# Patient Record
Sex: Female | Born: 1980 | Race: Black or African American | Hispanic: No | Marital: Single | Smoking: Never smoker
Health system: Southern US, Community
[De-identification: ages and names within clinical notes are randomized; demographics above are authoritative.]

## PROBLEM LIST (undated history)

## (undated) DIAGNOSIS — I10 Essential (primary) hypertension: Secondary | ICD-10-CM

## (undated) DIAGNOSIS — L0292 Furuncle, unspecified: Secondary | ICD-10-CM

## (undated) HISTORY — PX: TUBAL LIGATION: SHX77

---

## 2009-02-06 ENCOUNTER — Emergency Department (HOSPITAL_BASED_OUTPATIENT_CLINIC_OR_DEPARTMENT_OTHER): Admission: EM | Admit: 2009-02-06 | Discharge: 2009-02-06 | Payer: Self-pay | Admitting: Emergency Medicine

## 2009-08-15 ENCOUNTER — Emergency Department (HOSPITAL_BASED_OUTPATIENT_CLINIC_OR_DEPARTMENT_OTHER): Admission: EM | Admit: 2009-08-15 | Discharge: 2009-08-16 | Payer: Self-pay | Admitting: Emergency Medicine

## 2009-08-18 ENCOUNTER — Emergency Department (HOSPITAL_BASED_OUTPATIENT_CLINIC_OR_DEPARTMENT_OTHER): Admission: EM | Admit: 2009-08-18 | Discharge: 2009-08-18 | Payer: Self-pay | Admitting: Emergency Medicine

## 2009-10-26 ENCOUNTER — Ambulatory Visit: Payer: Self-pay | Admitting: Diagnostic Radiology

## 2009-10-26 ENCOUNTER — Emergency Department (HOSPITAL_BASED_OUTPATIENT_CLINIC_OR_DEPARTMENT_OTHER): Admission: EM | Admit: 2009-10-26 | Discharge: 2009-10-26 | Payer: Self-pay | Admitting: Emergency Medicine

## 2010-03-12 ENCOUNTER — Emergency Department (HOSPITAL_BASED_OUTPATIENT_CLINIC_OR_DEPARTMENT_OTHER): Admission: EM | Admit: 2010-03-12 | Discharge: 2010-03-12 | Payer: Self-pay | Admitting: Emergency Medicine

## 2010-03-22 ENCOUNTER — Emergency Department (HOSPITAL_BASED_OUTPATIENT_CLINIC_OR_DEPARTMENT_OTHER): Admission: EM | Admit: 2010-03-22 | Discharge: 2010-03-22 | Payer: Self-pay | Admitting: Emergency Medicine

## 2010-12-17 ENCOUNTER — Inpatient Hospital Stay (HOSPITAL_COMMUNITY): Payer: Medicaid Other

## 2010-12-17 ENCOUNTER — Inpatient Hospital Stay (HOSPITAL_COMMUNITY)
Admission: AD | Admit: 2010-12-17 | Discharge: 2010-12-18 | Disposition: A | Discharge status: Home / Self Care | Payer: Medicaid Other | Source: Ambulatory Visit | Attending: Family Medicine | Admitting: Family Medicine

## 2010-12-17 DIAGNOSIS — O479 False labor, unspecified: Secondary | ICD-10-CM | POA: Insufficient documentation

## 2010-12-17 DIAGNOSIS — W108XXA Fall (on) (from) other stairs and steps, initial encounter: Secondary | ICD-10-CM | POA: Insufficient documentation

## 2010-12-17 DIAGNOSIS — O9989 Other specified diseases and conditions complicating pregnancy, childbirth and the puerperium: Secondary | ICD-10-CM

## 2010-12-17 DIAGNOSIS — O99891 Other specified diseases and conditions complicating pregnancy: Secondary | ICD-10-CM | POA: Insufficient documentation

## 2010-12-18 LAB — WET PREP, GENITAL: Yeast Wet Prep HPF POC: NONE SEEN

## 2010-12-18 LAB — COMPREHENSIVE METABOLIC PANEL
ALT: 8 U/L (ref 0–35)
AST: 14 U/L (ref 0–37)
Alkaline Phosphatase: 470 U/L — ABNORMAL HIGH (ref 39–117)
BUN: 3 mg/dL — ABNORMAL LOW (ref 6–23)
GFR calc Af Amer: >60 mL/min (ref >60)
GFR calc non Af Amer: >60 mL/min (ref >60)
Sodium: 137 mEq/L (ref 135–145)
Total Bilirubin: 0.4 mg/dL (ref 0.3–1.2)
Total Protein: 6.4 g/dL (ref 6.0–8.3)

## 2010-12-18 LAB — URINALYSIS, ROUTINE W REFLEX MICROSCOPIC
Bilirubin Urine: NEGATIVE
Glucose, UA: NEGATIVE mg/dL
Ketones, ur: 15 mg/dL — AB
Nitrite: NEGATIVE
Specific Gravity, Urine: 1.025 (ref 1.005–1.030)

## 2010-12-18 LAB — PROTEIN / CREATININE RATIO, URINE
Creatinine, Urine: 159.9 mg/dL
Protein Creatinine Ratio: 0.27 — ABNORMAL HIGH (ref 0.00–0.15)

## 2010-12-18 LAB — CBC
Hemoglobin: 8.7 g/dL — ABNORMAL LOW (ref 12.0–15.0)
MCHC: 30.4 g/dL (ref 30.0–36.0)
RBC: 3.63 MIL/uL — ABNORMAL LOW (ref 3.87–5.11)
WBC: 14.2 10*3/uL — ABNORMAL HIGH (ref 4.0–10.5)

## 2010-12-18 LAB — URINE MICROSCOPIC-ADD ON

## 2010-12-18 LAB — RAPID URINE DRUG SCREEN, HOSP PERFORMED
Benzodiazepines: NOT DETECTED
Opiates: NOT DETECTED
Tetrahydrocannabinol: NOT DETECTED

## 2011-01-02 LAB — COMPREHENSIVE METABOLIC PANEL
AST: 12 U/L (ref 0–37)
BUN: 6 mg/dL (ref 6–23)
Calcium: 9.7 mg/dL (ref 8.4–10.5)
Chloride: 109 mEq/L (ref 96–112)
Creatinine, Ser: 0.5 mg/dL (ref 0.4–1.2)
GFR calc Af Amer: >60 mL/min (ref >60)
GFR calc non Af Amer: >60 mL/min (ref >60)
Sodium: 149 mEq/L — ABNORMAL HIGH (ref 135–145)

## 2011-01-02 LAB — URINALYSIS, ROUTINE W REFLEX MICROSCOPIC
Bilirubin Urine: NEGATIVE
Ketones, ur: 15 mg/dL — AB
Nitrite: NEGATIVE
Specific Gravity, Urine: 1.034 — ABNORMAL HIGH (ref 1.005–1.030)

## 2011-01-02 LAB — URINE MICROSCOPIC-ADD ON

## 2011-01-18 LAB — PREGNANCY, URINE: Preg Test, Ur: NEGATIVE

## 2011-01-25 ENCOUNTER — Emergency Department (HOSPITAL_BASED_OUTPATIENT_CLINIC_OR_DEPARTMENT_OTHER)
Admission: EM | Admit: 2011-01-25 | Discharge: 2011-01-25 | Disposition: A | Discharge status: Home / Self Care | Payer: Medicaid Other | Attending: Emergency Medicine | Admitting: Emergency Medicine

## 2011-01-25 ENCOUNTER — Emergency Department (INDEPENDENT_AMBULATORY_CARE_PROVIDER_SITE_OTHER): Payer: Medicaid Other

## 2011-01-25 DIAGNOSIS — M545 Low back pain, unspecified: Secondary | ICD-10-CM | POA: Insufficient documentation

## 2011-01-25 DIAGNOSIS — R109 Unspecified abdominal pain: Secondary | ICD-10-CM | POA: Insufficient documentation

## 2011-01-25 DIAGNOSIS — N83209 Unspecified ovarian cyst, unspecified side: Secondary | ICD-10-CM | POA: Insufficient documentation

## 2011-01-25 DIAGNOSIS — N949 Unspecified condition associated with female genital organs and menstrual cycle: Secondary | ICD-10-CM

## 2011-01-25 DIAGNOSIS — N72 Inflammatory disease of cervix uteri: Secondary | ICD-10-CM | POA: Insufficient documentation

## 2011-01-25 DIAGNOSIS — M549 Dorsalgia, unspecified: Secondary | ICD-10-CM

## 2011-01-25 LAB — COMPREHENSIVE METABOLIC PANEL
ALT: 10 U/L (ref 0–35)
Albumin: 5.1 g/dL (ref 3.5–5.2)
Alkaline Phosphatase: 97 U/L (ref 39–117)
BUN: 7 mg/dL (ref 6–23)
CO2: 27 mEq/L (ref 19–32)
Calcium: 9.8 mg/dL (ref 8.4–10.5)
Creatinine, Ser: 0.6 mg/dL (ref 0.4–1.2)
Sodium: 145 mEq/L (ref 135–145)

## 2011-01-25 LAB — DIFFERENTIAL
Basophils Relative: 1 % (ref 0–1)
Eosinophils Absolute: 0.1 10*3/uL (ref 0.0–0.7)
Eosinophils Relative: 1 % (ref 0–5)
Lymphocytes Relative: 30 % (ref 12–46)
Lymphs Abs: 4.3 10*3/uL — ABNORMAL HIGH (ref 0.7–4.0)
Monocytes Relative: 4 % (ref 3–12)
Neutro Abs: 9.1 10*3/uL — ABNORMAL HIGH (ref 1.7–7.7)

## 2011-01-25 LAB — URINALYSIS, ROUTINE W REFLEX MICROSCOPIC
Bilirubin Urine: NEGATIVE
Bilirubin Urine: NEGATIVE
Protein, ur: NEGATIVE mg/dL
Specific Gravity, Urine: 1.012 (ref 1.005–1.030)
Specific Gravity, Urine: 1.023 (ref 1.005–1.030)

## 2011-01-25 LAB — CBC
HCT: 40.1 % (ref 36.0–46.0)
Hemoglobin: 12.9 g/dL (ref 12.0–15.0)
MCHC: 32 g/dL (ref 30.0–36.0)
MCV: 77.4 fL — ABNORMAL LOW (ref 78.0–100.0)
RDW: 15.6 % — ABNORMAL HIGH (ref 11.5–15.5)

## 2011-01-25 LAB — URINE MICROSCOPIC-ADD ON

## 2011-01-25 LAB — WET PREP, GENITAL
Trich, Wet Prep: NONE SEEN
Yeast Wet Prep HPF POC: NONE SEEN

## 2011-01-25 LAB — URINE CULTURE: Colony Count: >=100000

## 2011-01-25 LAB — PREGNANCY, URINE
Preg Test, Ur: NEGATIVE
Preg Test, Ur: NEGATIVE

## 2011-01-26 LAB — GC/CHLAMYDIA PROBE AMP, GENITAL: Chlamydia, DNA Probe: NEGATIVE

## 2012-05-28 IMAGING — US US TRANSVAGINAL NON-OB
1 series · 14 of 25 positions shown · non-contrast
Comparison: Obstetrical sonogram 12/17/2010

CLINICAL DATA: Abdomen, back, and vaginal pain.  Postpartum 1
month.

TRANSABDOMINAL AND TRANSVAGINAL ULTRASOUND OF PELVIS
TECHNIQUE: Both transabdominal and transvaginal ultrasound
examinations of the pelvis were performed. Transabdominal technique
was performed for global imaging of the pelvis including uterus,
ovaries, adnexal regions, and pelvic cul-de-sac.
It was necessary to proceed with endovaginal exam following the
transabdominal exam to visualize the ovaries, which are posterior
in position.

[Series 1: us transvaginal non-ob · 0.30mm/px · 14 of 69 slices shown]
[im 1/69]
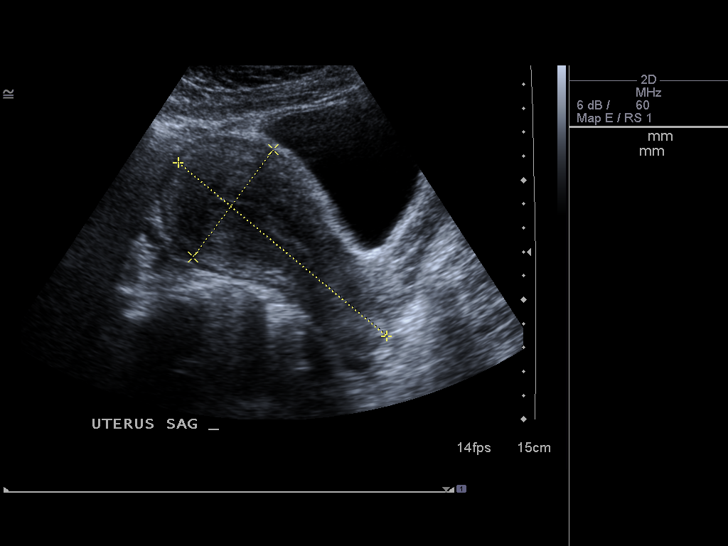
[im 6/69]
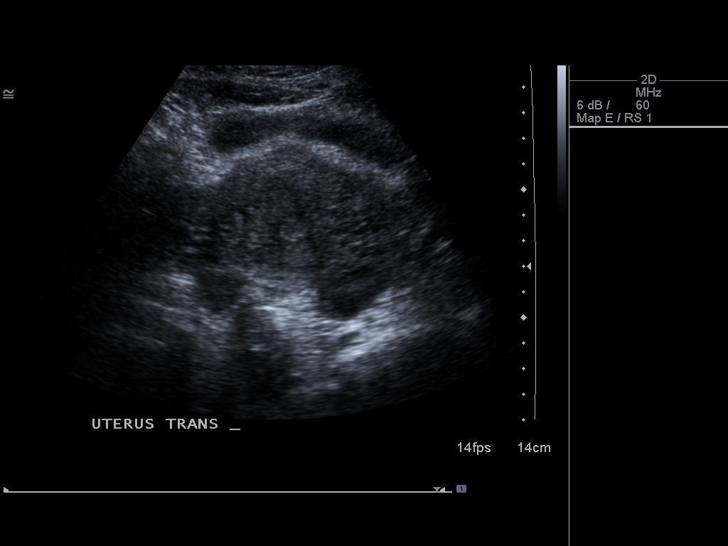
[im 12/69]
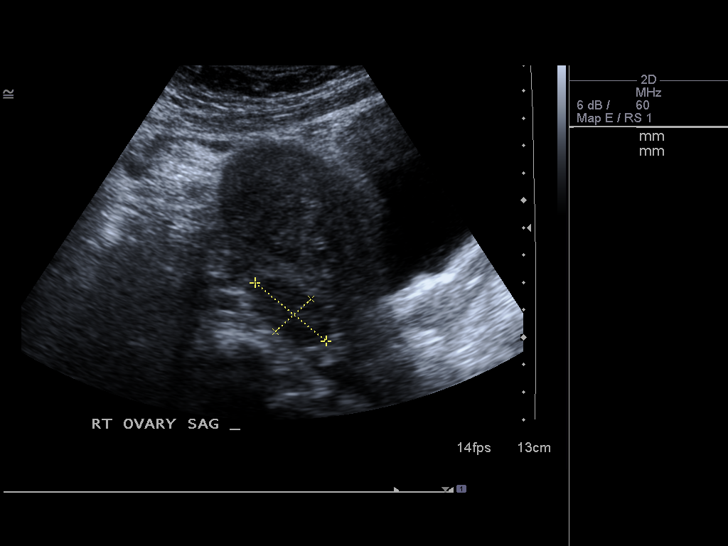
[im 18/69]
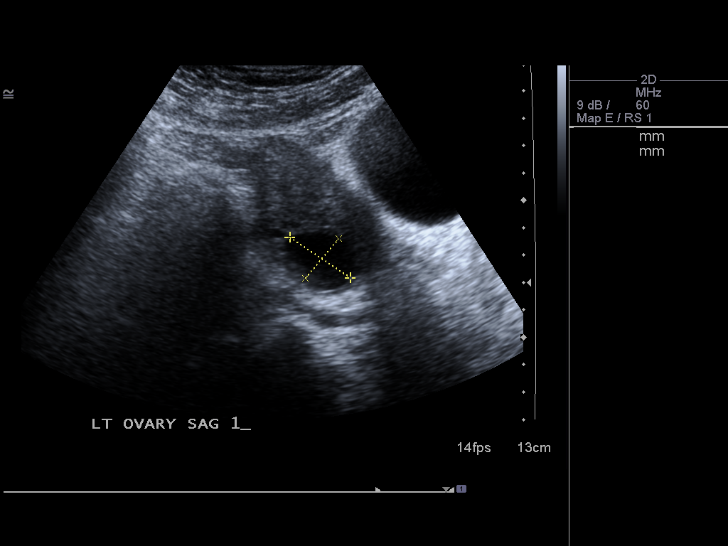
[im 23/69]
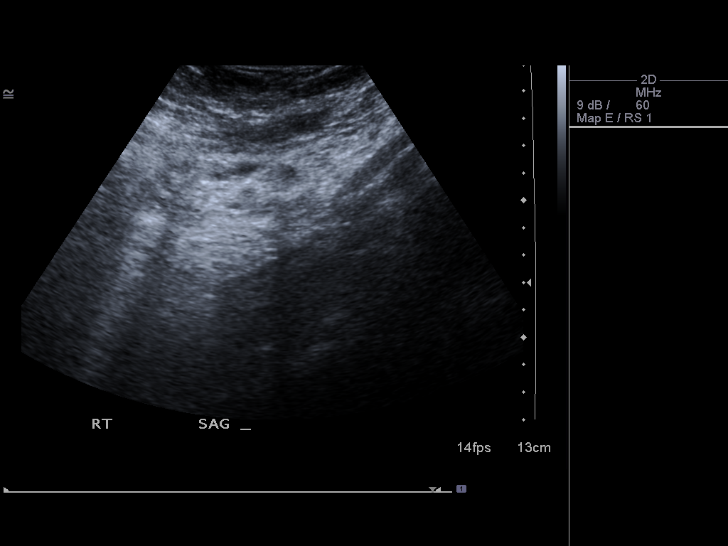
[im 26/69]
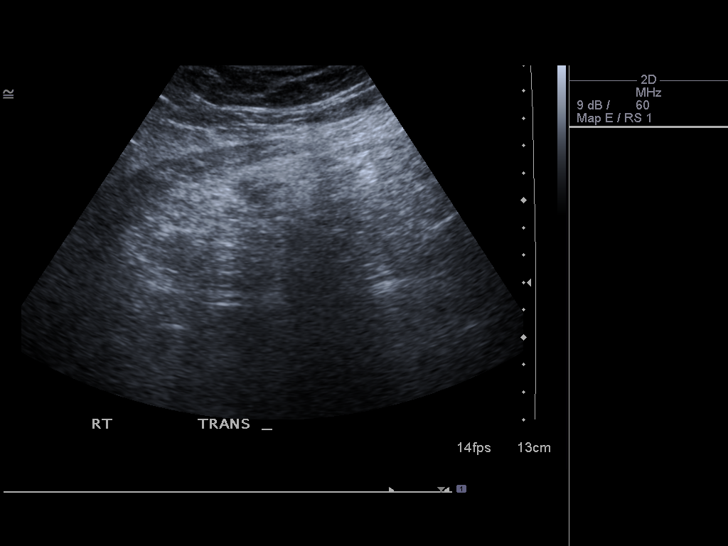
[im 32/69]
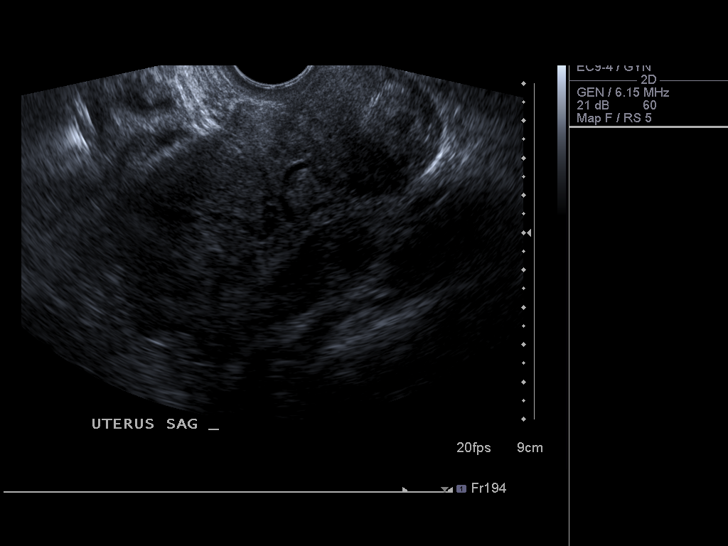
[im 37/69]
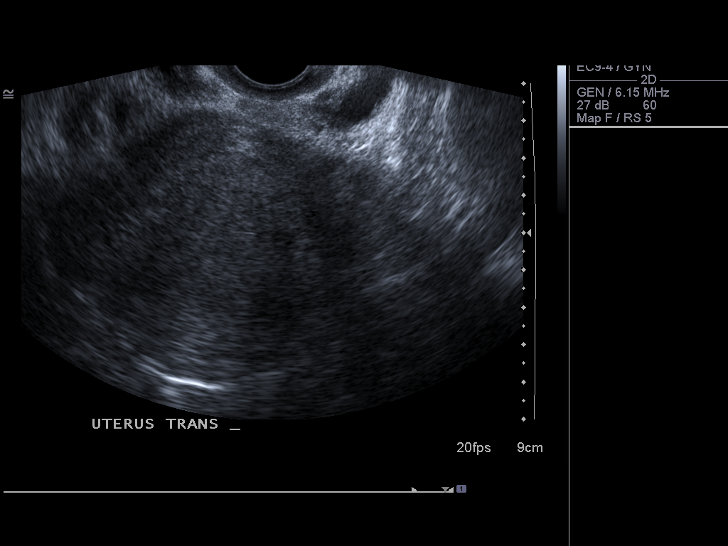
[im 43/69]
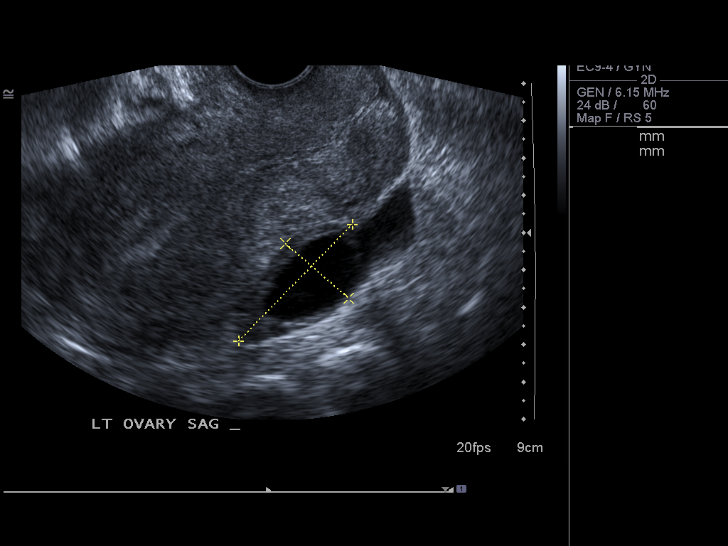
[im 46/69]
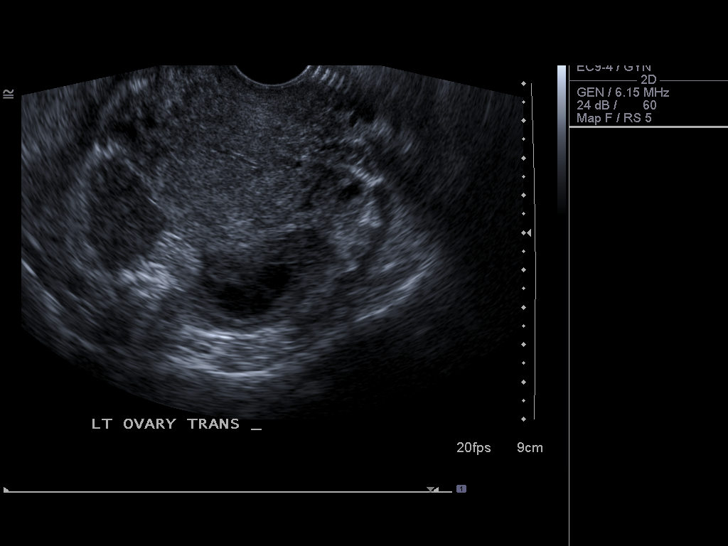
[im 52/69]
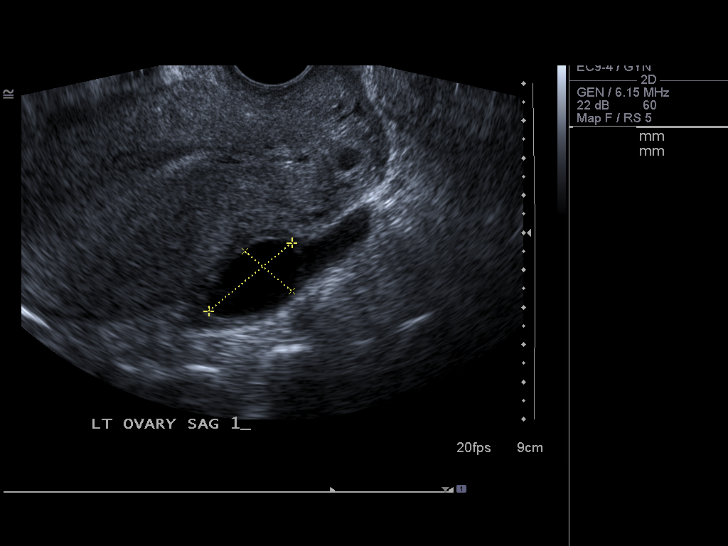
[im 57/69]
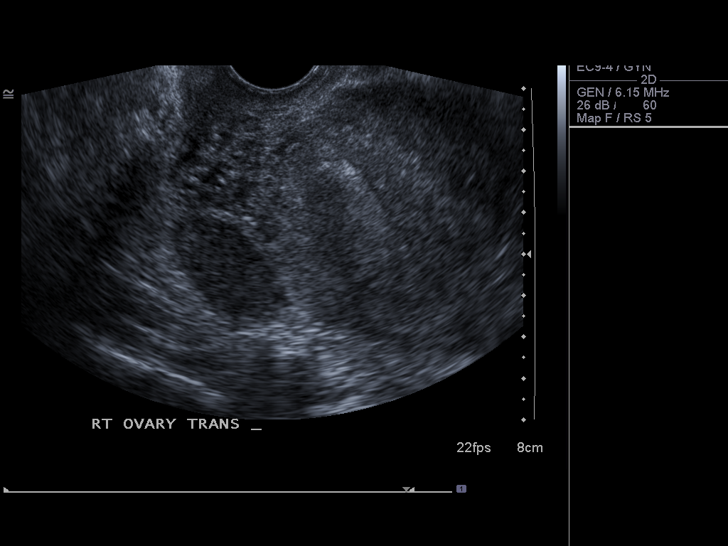
[im 63/69]
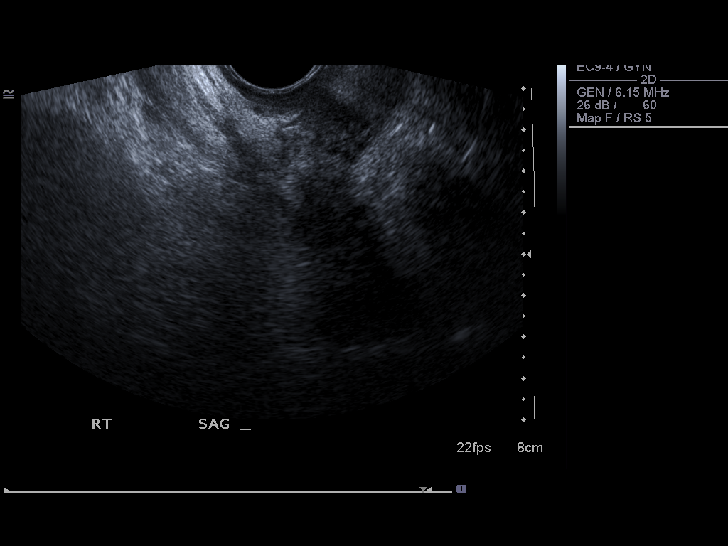
[im 69/69]
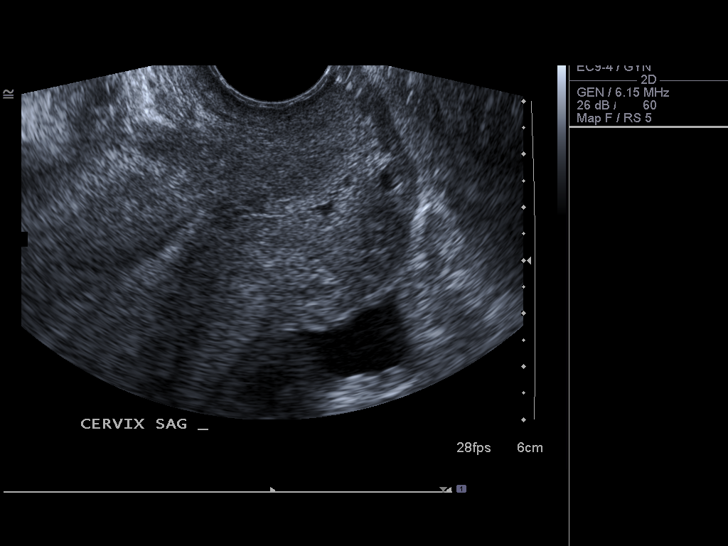

[14 of 25 positions shown; findings below may reference images not displayed]

FINDINGS: Uterus:  11.3 x 5.6 x 7.5 cm (length x AP x width).  No fibroids or
other uterine masses identified.

Endometrium:  9 mm and homogeneous.

Right ovary:  3.2 x 2.1 x 2.4 cm.  Normal appearance/no adnexal
mass.

Left ovary:  4.4 x 2.2 x 3.1 cm.    There is a 2.9 cm simple cyst.

Other findings:  There is a trace amount of free pelvic fluid,
unlikely to be clinically significant.
IMPRESSION: No pathological findings.

## 2013-08-25 ENCOUNTER — Emergency Department (HOSPITAL_BASED_OUTPATIENT_CLINIC_OR_DEPARTMENT_OTHER)
Admission: EM | Admit: 2013-08-25 | Discharge: 2013-08-25 | Disposition: A | Discharge status: Home / Self Care | Payer: Medicaid Other | Attending: Emergency Medicine | Admitting: Emergency Medicine

## 2013-08-25 ENCOUNTER — Encounter (HOSPITAL_BASED_OUTPATIENT_CLINIC_OR_DEPARTMENT_OTHER): Payer: Self-pay | Admitting: Emergency Medicine

## 2013-08-25 DIAGNOSIS — L02219 Cutaneous abscess of trunk, unspecified: Secondary | ICD-10-CM | POA: Insufficient documentation

## 2013-08-25 DIAGNOSIS — I10 Essential (primary) hypertension: Secondary | ICD-10-CM | POA: Insufficient documentation

## 2013-08-25 DIAGNOSIS — L0291 Cutaneous abscess, unspecified: Secondary | ICD-10-CM

## 2013-08-25 HISTORY — DX: Essential (primary) hypertension: I10

## 2013-08-25 MED ORDER — SULFAMETHOXAZOLE-TRIMETHOPRIM 800-160 MG PO TABS: 1.0000 | ORAL_TABLET | Freq: Two times a day (BID) | ORAL | Status: AC

## 2013-08-25 MED ORDER — HYDROCODONE-ACETAMINOPHEN 5-325 MG PO TABS: 2.0000 | ORAL_TABLET | ORAL | Status: DC | PRN

## 2013-08-25 NOTE — ED Notes (Signed)
Abscess to her right groin.

## 2013-08-25 NOTE — ED Provider Notes (Signed)
Medical screening examination/treatment/procedure(s) were performed by non-physician practitioner and as supervising physician I was immediately available for consultation/collaboration.  EKG Interpretation   None         Junius Argyle, MD 08/25/13 2342

## 2013-08-25 NOTE — ED Provider Notes (Signed)
CSN: [id number]     Arrival date & time 08/25/13  1614 History   First MD Initiated Contact with Patient 08/25/13 1624     Chief Complaint  Patient presents with  . Abscess   (Consider location/radiation/quality/duration/timing/severity/associated sxs/prior Treatment) Patient is a 32 y.o. female presenting with abscess. The history is provided by the patient. No language interpreter was used.  Abscess Location:  Ano-genital Ano-genital abscess location:  Groin Size:  2 Abscess quality: fluctuance, induration, redness and warmth   Abscess quality: not draining   Red streaking: no   Progression:  Worsening Chronicity:  New Context: not diabetes   Relieved by:  Nothing Worsened by:  Nothing tried Ineffective treatments:  None tried   Past Medical History  Diagnosis Date  . Hypertension    Past Surgical History  Procedure Laterality Date  . Tubal ligation     No family history on file. History  Substance Use Topics  . Smoking status: Never Smoker   . Smokeless tobacco: Not on file  . Alcohol Use: No   OB History   Grav Para Term Preterm Abortions TAB SAB Ect Mult Living                 Review of Systems  Skin: Positive for wound.  All other systems reviewed and are negative.    Allergies  Review of patient's allergies indicates no known allergies.  Home Medications   Current Outpatient Rx  Name  Route  Sig  Dispense  Refill  . Carvedilol (COREG PO)   Oral   Take by mouth.          BP 165/95  Pulse 106  Temp(Src) 98.5 F (36.9 C) (Oral)  Resp 16  Ht 5\' 5"  (1.651 m)  Wt 160 lb (72.576 kg)  BMI 26.63 kg/m2  SpO2 100% Physical Exam  Nursing note and vitals reviewed. Constitutional: She is oriented to person, place, and time. She appears well-developed and well-nourished.  HENT:  Head: Normocephalic.  Eyes: Pupils are equal, round, and reactive to light.  Neck: Normal range of motion.  Cardiovascular: Normal rate.   Pulmonary/Chest: Effort  normal.  Abdominal: Soft.  Neurological: She is alert and oriented to person, place, and time.  Skin: There is erythema.  2x3 cm area of erythema  Psychiatric: She has a normal mood and affect.    ED Course  INCISION AND DRAINAGE Date/Time: 08/25/2013 5:42 PM Performed by: Cheron Schaumann K Authorized by: Cheron Schaumann K Risks and benefits: risks, benefits and alternatives were discussed Consent given by: patient Required items: required blood products, implants, devices, and special equipment available Patient identity confirmed: verbally with patient Time out: Immediately prior to procedure a "time out" was called to verify the correct patient, procedure, equipment, support staff and site/side marked as required. Type: abscess Body area: trunk Anesthesia: local infiltration Local anesthetic: lidocaine 2% without epinephrine Scalpel size: 11 Incision type: single straight Complexity: simple Drainage: purulent Drainage amount: scant Packing material: 1/2 in gauze Patient tolerance: Patient tolerated the procedure well with no immediate complications.   (including critical care time) Labs Review Labs Reviewed - No data to display Imaging Review No results found.  EKG Interpretation   None       MDM   1. Abscess    Bactrim and hydrocodone    Elson Areas, PA-C 08/25/13 1746

## 2013-12-13 ENCOUNTER — Encounter (HOSPITAL_BASED_OUTPATIENT_CLINIC_OR_DEPARTMENT_OTHER): Payer: Self-pay | Admitting: Emergency Medicine

## 2013-12-13 ENCOUNTER — Emergency Department (HOSPITAL_BASED_OUTPATIENT_CLINIC_OR_DEPARTMENT_OTHER)
Admission: EM | Admit: 2013-12-13 | Discharge: 2013-12-13 | Disposition: A | Discharge status: Home / Self Care | Payer: Medicaid Other | Attending: Emergency Medicine | Admitting: Emergency Medicine

## 2013-12-13 DIAGNOSIS — L0291 Cutaneous abscess, unspecified: Secondary | ICD-10-CM

## 2013-12-13 DIAGNOSIS — IMO0002 Reserved for concepts with insufficient information to code with codable children: Secondary | ICD-10-CM | POA: Insufficient documentation

## 2013-12-13 DIAGNOSIS — Z79899 Other long term (current) drug therapy: Secondary | ICD-10-CM | POA: Insufficient documentation

## 2013-12-13 DIAGNOSIS — I10 Essential (primary) hypertension: Secondary | ICD-10-CM | POA: Insufficient documentation

## 2013-12-13 HISTORY — DX: Furuncle, unspecified: L02.92

## 2013-12-13 MED ORDER — HYDROCODONE-ACETAMINOPHEN 5-325 MG PO TABS: 1.0000 | ORAL_TABLET | Freq: Four times a day (QID) | ORAL | Status: DC | PRN

## 2013-12-13 MED ORDER — SULFAMETHOXAZOLE-TMP DS 800-160 MG PO TABS
1.0000 | ORAL_TABLET | Freq: Once | ORAL | Status: AC
  Administered 2013-12-13: 1 via ORAL
  Filled 2013-12-13: qty 1

## 2013-12-13 MED ORDER — SULFAMETHOXAZOLE-TRIMETHOPRIM 800-160 MG PO TABS: 1.0000 | ORAL_TABLET | Freq: Two times a day (BID) | ORAL | Status: DC

## 2013-12-13 NOTE — ED Provider Notes (Signed)
CSN: [id number]     Arrival date & time 12/13/13  0309 History   None    Chief Complaint  Patient presents with  . Recurrent Skin Infections     (Consider location/radiation/quality/duration/timing/severity/associated sxs/prior Treatment) Patient is a 33 y.o. female presenting with abscess. The history is provided by the patient.  Abscess Location:  Shoulder/arm Shoulder/arm abscess location:  R axilla Abscess quality: induration, painful and redness   Red streaking: no   Progression:  Worsening Pain details:    Quality:  Dull   Severity:  Moderate   Timing:  Constant   Progression:  Worsening Chronicity:  Recurrent Context: not diabetes   Relieved by:  Nothing Worsened by:  Nothing tried Ineffective treatments:  None tried Associated symptoms: no anorexia   Risk factors: no family hx of MRSA     Past Medical History  Diagnosis Date  . Hypertension   . Boil    Past Surgical History  Procedure Laterality Date  . Tubal ligation     History reviewed. No pertinent family history. History  Substance Use Topics  . Smoking status: Never Smoker   . Smokeless tobacco: Not on file  . Alcohol Use: No   OB History   Grav Para Term Preterm Abortions TAB SAB Ect Mult Living                 Review of Systems  Gastrointestinal: Negative for anorexia.  All other systems reviewed and are negative.      Allergies  Review of patient's allergies indicates no known allergies.  Home Medications   Current Outpatient Rx  Name  Route  Sig  Dispense  Refill  . Carvedilol (COREG PO)   Oral   Take by mouth.         Marland Kitchen HYDROcodone-acetaminophen (NORCO/VICODIN) 5-325 MG per tablet   Oral   Take 2 tablets by mouth every 4 (four) hours as needed.   10 tablet   0    BP 137/83  Pulse 98  Temp(Src) 98.7 F (37.1 C) (Oral)  Resp 20  Ht 5\' 4"  (1.626 m)  Wt 162 lb (73.483 kg)  BMI 27.79 kg/m2  SpO2 100%  LMP 12/01/2013 Physical Exam  Constitutional: She is oriented  to person, place, and time. She appears well-developed.  HENT:  Head: Normocephalic and atraumatic.  Eyes: Conjunctivae are normal. Pupils are equal, round, and reactive to light.  Neck: Normal range of motion. Neck supple.  Cardiovascular: Normal rate and regular rhythm.   Pulmonary/Chest: Effort normal and breath sounds normal. She has no wheezes. She has no rales.  Abdominal: Soft. Bowel sounds are normal. There is no tenderness.  Musculoskeletal: Normal range of motion.  Neurological: She is alert and oriented to person, place, and time.  Skin: Skin is warm and dry.  3 cm abscess with overlying cellulitis in the right axilla  Psychiatric: She has a normal mood and affect.    ED Course  INCISION AND DRAINAGE Date/Time: 12/13/2013 3:55 AM Performed by: Maggie Schwalbe K Authorized by: Carlisle Beers Consent given by: patient Patient identity confirmed: arm band Type: abscess Body area: upper extremity (right axilla) Anesthesia: local infiltration Local anesthetic: lidocaine 1% with epinephrine Anesthetic total: 3 ml Patient sedated: no Scalpel size: 11 Incision type: single straight Complexity: complex Drainage: purulent Drainage amount: copious Wound treatment: wound left open Patient tolerance: Patient tolerated the procedure well with no immediate complications.   (including critical care time) Labs Review Labs Reviewed - No data  to display Imaging Review No results found.   EKG Interpretation None      MDM   Final diagnoses:  None    Abscess with overlying cellulitis will start bactrim and norco for pain, return precautions given   Doneshia Hill Alfonso Patten, MD 12/13/13 [phone]

## 2013-12-13 NOTE — ED Notes (Signed)
Pt reports boil under right arm x 3 days

## 2013-12-13 NOTE — Discharge Instructions (Signed)

## 2014-01-19 ENCOUNTER — Encounter (HOSPITAL_BASED_OUTPATIENT_CLINIC_OR_DEPARTMENT_OTHER): Payer: Self-pay | Admitting: Emergency Medicine

## 2014-01-19 ENCOUNTER — Emergency Department (HOSPITAL_BASED_OUTPATIENT_CLINIC_OR_DEPARTMENT_OTHER)
Admission: EM | Admit: 2014-01-19 | Discharge: 2014-01-19 | Disposition: A | Discharge status: Home / Self Care | Payer: Medicaid Other | Attending: Emergency Medicine | Admitting: Emergency Medicine

## 2014-01-19 DIAGNOSIS — Z872 Personal history of diseases of the skin and subcutaneous tissue: Secondary | ICD-10-CM | POA: Insufficient documentation

## 2014-01-19 DIAGNOSIS — Z792 Long term (current) use of antibiotics: Secondary | ICD-10-CM | POA: Insufficient documentation

## 2014-01-19 DIAGNOSIS — N390 Urinary tract infection, site not specified: Secondary | ICD-10-CM | POA: Insufficient documentation

## 2014-01-19 DIAGNOSIS — Z79899 Other long term (current) drug therapy: Secondary | ICD-10-CM | POA: Insufficient documentation

## 2014-01-19 DIAGNOSIS — I10 Essential (primary) hypertension: Secondary | ICD-10-CM | POA: Insufficient documentation

## 2014-01-19 DIAGNOSIS — Z3202 Encounter for pregnancy test, result negative: Secondary | ICD-10-CM | POA: Insufficient documentation

## 2014-01-19 LAB — URINALYSIS, ROUTINE W REFLEX MICROSCOPIC
Bilirubin Urine: NEGATIVE
Glucose, UA: NEGATIVE mg/dL
KETONES UR: NEGATIVE mg/dL
Nitrite: NEGATIVE
PROTEIN: 30 mg/dL — AB
Specific Gravity, Urine: 1.019 (ref 1.005–1.030)
UROBILINOGEN UA: 1 mg/dL (ref 0.0–1.0)
pH: 6 (ref 5.0–8.0)

## 2014-01-19 LAB — PREGNANCY, URINE: PREG TEST UR: NEGATIVE

## 2014-01-19 LAB — URINE MICROSCOPIC-ADD ON

## 2014-01-19 MED ORDER — LIDOCAINE HCL (PF) 1 % IJ SOLN
INTRAMUSCULAR | Status: AC
  Administered 2014-01-19: 2 mL via INTRAMUSCULAR
  Filled 2014-01-19: qty 5

## 2014-01-19 MED ORDER — HYDROCODONE-ACETAMINOPHEN 5-325 MG PO TABS: 2.0000 | ORAL_TABLET | ORAL | Status: DC | PRN

## 2014-01-19 MED ORDER — CEPHALEXIN 500 MG PO CAPS: 500.0000 mg | ORAL_CAPSULE | Freq: Four times a day (QID) | ORAL | Status: DC

## 2014-01-19 MED ORDER — HYDROCODONE-ACETAMINOPHEN 5-325 MG PO TABS
2.0000 | ORAL_TABLET | Freq: Once | ORAL | Status: AC
  Administered 2014-01-19: 2 via ORAL
  Filled 2014-01-19: qty 2

## 2014-01-19 MED ORDER — PHENAZOPYRIDINE HCL 200 MG PO TABS: 200.0000 mg | ORAL_TABLET | Freq: Three times a day (TID) | ORAL | Status: DC | PRN

## 2014-01-19 MED ORDER — CEFTRIAXONE SODIUM 1 G IJ SOLR
1.0000 g | Freq: Once | INTRAMUSCULAR | Status: AC
  Administered 2014-01-19: 1 g via INTRAMUSCULAR
  Filled 2014-01-19: qty 10

## 2014-01-19 NOTE — Discharge Instructions (Signed)
Keflex as prescribed.  Pyridium as prescribed as needed for burning.  The conus prescribed as needed for pain.  Return to the emergency department if you develop high fever, severe pain, vomiting with an inability to keep your medications down, or other new and concerning symptoms.   Urinary Tract Infection Urinary tract infections (UTIs) can develop anywhere along your urinary tract. Your urinary tract is your body's drainage system for removing wastes and extra water. Your urinary tract includes two kidneys, two ureters, a bladder, and a urethra. Your kidneys are a pair of bean-shaped organs. Each kidney is about the size of your fist. They are located below your ribs, one on each side of your spine. CAUSES Infections are caused by microbes, which are microscopic organisms, including fungi, viruses, and bacteria. These organisms are so small that they can only be seen through a microscope. Bacteria are the microbes that most commonly cause UTIs. SYMPTOMS  Symptoms of UTIs may vary by age and gender of the patient and by the location of the infection. Symptoms in young women typically include a frequent and intense urge to urinate and a painful, burning feeling in the bladder or urethra during urination. Older women and men are more likely to be tired, shaky, and weak and have muscle aches and abdominal pain. A fever may mean the infection is in your kidneys. Other symptoms of a kidney infection include pain in your back or sides below the ribs, nausea, and vomiting. DIAGNOSIS To diagnose a UTI, your caregiver will ask you about your symptoms. Your caregiver also will ask to provide a urine sample. The urine sample will be tested for bacteria and white blood cells. White blood cells are made by your body to help fight infection. TREATMENT  Typically, UTIs can be treated with medication. Because most UTIs are caused by a bacterial infection, they usually can be treated with the use of antibiotics.  The choice of antibiotic and length of treatment depend on your symptoms and the type of bacteria causing your infection. HOME CARE INSTRUCTIONS  If you were prescribed antibiotics, take them exactly as your caregiver instructs you. Finish the medication even if you feel better after you have only taken some of the medication.  Drink enough water and fluids to keep your urine clear or pale yellow.  Avoid caffeine, tea, and carbonated beverages. They tend to irritate your bladder.  Empty your bladder often. Avoid holding urine for long periods of time.  Empty your bladder before and after sexual intercourse.  After a bowel movement, women should cleanse from front to back. Use each tissue only once. SEEK MEDICAL CARE IF:   You have back pain.  You develop a fever.  Your symptoms do not begin to resolve within 3 days. SEEK IMMEDIATE MEDICAL CARE IF:   You have severe back pain or lower abdominal pain.  You develop chills.  You have nausea or vomiting.  You have continued burning or discomfort with urination. MAKE SURE YOU:   Understand these instructions.  Will watch your condition.  Will get help right away if you are not doing well or get worse. Document Released: 07/12/2005 Document Revised: 04/02/2012 Document Reviewed: 11/10/2011 [facility] Patient Information 2014 [place].

## 2014-01-19 NOTE — ED Notes (Signed)
C/o bilat flank pain x 4 days

## 2014-01-19 NOTE — ED Provider Notes (Signed)
CSN: [id number]     Arrival date & time 01/19/14  1746 History   This chart was scribed for Veryl Speak, MD by Celesta Gentile, ED Scribe. The patient was seen in room MH07/MH07. Patient's care was started at 6:44 PM.    Chief Complaint  Patient presents with  . Flank Pain   The history is provided by the patient. No language interpreter was used.   HPI Comments: Natasha Castro is a 33 y.o. female who presents to the Emergency Department complaining of bilateral flank pain that started about 4 days ago.  She states the pain is worse on the left side than the right.  She has associated pain in her lower back and abdominal pain that she describes as spasms.  She states she has dysuria and hematuria.  Pt states she had an episode of emesis today, but denies diarrhea, fevers, and chills.  She reports her bowel movements are irregular, but is normal for her.  She reports her last menstrual cycle was about 2 weeks ago.  Pt reports she had a tubal ligation.    Past Medical History  Diagnosis Date  . Hypertension   . Boil    Past Surgical History  Procedure Laterality Date  . Tubal ligation     No family history on file. History  Substance Use Topics  . Smoking status: Never Smoker   . Smokeless tobacco: Not on file  . Alcohol Use: No   OB History   Grav Para Term Preterm Abortions TAB SAB Ect Mult Living                 Review of Systems  A complete 10 system review of systems was obtained and all systems are negative except as noted in the HPI and PMH.   Allergies  Review of patient's allergies indicates no known allergies.  Home Medications   Current Outpatient Rx  Name  Route  Sig  Dispense  Refill  . Carvedilol (COREG PO)   Oral   Take by mouth.         Marland Kitchen HYDROcodone-acetaminophen (NORCO) 5-325 MG per tablet   Oral   Take 1 tablet by mouth every 6 (six) hours as needed.   10 tablet   0   . HYDROcodone-acetaminophen (NORCO/VICODIN) 5-325 MG per tablet   Oral   Take 2  tablets by mouth every 4 (four) hours as needed.   10 tablet   0   . sulfamethoxazole-trimethoprim (SEPTRA DS) 800-160 MG per tablet   Oral   Take 1 tablet by mouth 2 (two) times daily.   14 tablet   0    Triage Vitals: BP 143/84  Pulse 94  Temp(Src) 98.7 F (37.1 C) (Oral)  Resp 16  Ht 5\' 4"  (1.626 m)  Wt 160 lb (72.576 kg)  BMI 27.45 kg/m2  SpO2 100%  LMP 01/05/2014  Physical Exam  Nursing note and vitals reviewed. Constitutional: She is oriented to person, place, and time. She appears well-developed and well-nourished. No distress.  HENT:  Head: Normocephalic and atraumatic.  Eyes: Conjunctivae and EOM are normal. Right eye exhibits no discharge. Left eye exhibits no discharge.  Neck: Neck supple. No tracheal deviation present.  Cardiovascular: Normal rate, regular rhythm and normal heart sounds.  Exam reveals no gallop and no friction rub.   No murmur heard. Pulmonary/Chest: Effort normal and breath sounds normal. No respiratory distress. She has no wheezes. She has no rales.  Abdominal: Soft. She exhibits no distension  and no mass. There is tenderness (TTP suprapubic region and left flank). There is no rebound and no guarding.  Musculoskeletal: Normal range of motion.  Neurological: She is alert and oriented to person, place, and time.  Skin: Skin is warm and dry. No rash noted. She is not diaphoretic.  Psychiatric: She has a normal mood and affect. Her behavior is normal. Judgment and thought content normal.    ED Course  Procedures (including critical care time)  DIAGNOSTIC STUDIES: Oxygen Saturation is 100% on RA, normal by my interpretation.    COORDINATION OF CARE: 6:50 PM-Informed pt her urine showed a UTI and possible kidney infection.  Will discharge with Keflex, pyridium, and hydrocodone.  Patient informed of current plan of treatment and evaluation and agrees with plan.    Results for orders placed during the hospital encounter of 01/19/14  URINALYSIS,  ROUTINE W REFLEX MICROSCOPIC      Result Value Ref Range   Color, Urine YELLOW  YELLOW   APPearance TURBID (*) CLEAR   Specific Gravity, Urine 1.019  1.005 - 1.030   pH 6.0  5.0 - 8.0   Glucose, UA NEGATIVE  NEGATIVE mg/dL   Hgb urine dipstick LARGE (*) NEGATIVE   Bilirubin Urine NEGATIVE  NEGATIVE   Ketones, ur NEGATIVE  NEGATIVE mg/dL   Protein, ur 30 (*) NEGATIVE mg/dL   Urobilinogen, UA 1.0  0.0 - 1.0 mg/dL   Nitrite NEGATIVE  NEGATIVE   Leukocytes, UA LARGE (*) NEGATIVE  PREGNANCY, URINE      Result Value Ref Range   Preg Test, Ur NEGATIVE  NEGATIVE  URINE MICROSCOPIC-ADD ON      Result Value Ref Range   Squamous Epithelial / LPF RARE  RARE   WBC, UA TOO NUMEROUS TO COUNT  <3 WBC/hpf   RBC / HPF 7-10  <3 RBC/hpf   Bacteria, UA MANY (*) RARE   Urine-Other MUCOUS PRESENT      MDM   Final diagnoses:  None    Patient is a 33 year old female who presents with flank pain, burning with urination, and hematuria for the past several days. She has a history of UTIs. Urinalysis reveals too numerous to count whites, large leukocytes and many bacteria. She will be treated with Keflex, pyridium, and pain medication. She is to return if she develops any high fever, vomiting with an inability to keep her medications down.   I personally performed the services described in this documentation, which was scribed in my presence. The recorded information has been reviewed and is accurate.      Veryl Speak, MD 01/19/14 [phone]

## 2014-02-23 ENCOUNTER — Emergency Department (HOSPITAL_BASED_OUTPATIENT_CLINIC_OR_DEPARTMENT_OTHER)
Admission: EM | Admit: 2014-02-23 | Discharge: 2014-02-23 | Disposition: A | Discharge status: Home / Self Care | Payer: No Typology Code available for payment source | Attending: Emergency Medicine | Admitting: Emergency Medicine

## 2014-02-23 ENCOUNTER — Encounter (HOSPITAL_BASED_OUTPATIENT_CLINIC_OR_DEPARTMENT_OTHER): Payer: Self-pay | Admitting: Emergency Medicine

## 2014-02-23 DIAGNOSIS — Z792 Long term (current) use of antibiotics: Secondary | ICD-10-CM | POA: Insufficient documentation

## 2014-02-23 DIAGNOSIS — Z872 Personal history of diseases of the skin and subcutaneous tissue: Secondary | ICD-10-CM | POA: Insufficient documentation

## 2014-02-23 DIAGNOSIS — Z79899 Other long term (current) drug therapy: Secondary | ICD-10-CM | POA: Insufficient documentation

## 2014-02-23 DIAGNOSIS — S139XXA Sprain of joints and ligaments of unspecified parts of neck, initial encounter: Secondary | ICD-10-CM | POA: Insufficient documentation

## 2014-02-23 DIAGNOSIS — Y92410 Unspecified street and highway as the place of occurrence of the external cause: Secondary | ICD-10-CM | POA: Insufficient documentation

## 2014-02-23 DIAGNOSIS — I10 Essential (primary) hypertension: Secondary | ICD-10-CM | POA: Insufficient documentation

## 2014-02-23 DIAGNOSIS — Y9389 Activity, other specified: Secondary | ICD-10-CM | POA: Insufficient documentation

## 2014-02-23 DIAGNOSIS — S161XXA Strain of muscle, fascia and tendon at neck level, initial encounter: Secondary | ICD-10-CM

## 2014-02-23 MED ORDER — HYDROCODONE-ACETAMINOPHEN 5-325 MG PO TABS
1.0000 | ORAL_TABLET | Freq: Four times a day (QID) | ORAL | Status: DC | PRN
Start: 2014-02-23 — End: 2014-12-31

## 2014-02-23 MED ORDER — CYCLOBENZAPRINE HCL 10 MG PO TABS: 10.0000 mg | ORAL_TABLET | Freq: Two times a day (BID) | ORAL | Status: DC | PRN

## 2014-02-23 MED ORDER — IBUPROFEN 600 MG PO TABS: 600.0000 mg | ORAL_TABLET | Freq: Four times a day (QID) | ORAL | Status: DC | PRN
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
## 2014-02-23 NOTE — Discharge Instructions (Signed)
Cervical Sprain °A cervical sprain is when the tissues (ligaments) that hold the neck bones in place stretch or tear. °HOME CARE  °· Put ice on the injured area. °· Put ice in a plastic bag. °· Place a towel between your skin and the bag. °· Leave the ice on for 15 20 minutes, 3 4 times a day. °· You may have been given a collar to wear. This collar keeps your neck from moving while you heal. °· Do not take the collar off unless told by your doctor. °· If you have long hair, keep it outside of the collar. °· Ask your doctor before changing the position of your collar. You may need to change its position over time to make it more comfortable. °· If you are allowed to take off the collar for cleaning or bathing, follow your doctor's instructions on how to do it safely. °· Keep your collar clean by wiping it with mild soap and water. Dry it completely. If the collar has removable pads, remove them every 1 2 days to hand wash them with soap and water. Allow them to air dry. They should be dry before you wear them in the collar. °· Do not drive while wearing the collar. °· Only take medicine as told by your doctor. °· Keep all doctor visits as told. °· Keep all physical therapy visits as told. °· Adjust your work station so that you have good posture while you work. °· Avoid positions and activities that make your problems worse. °· Warm up and stretch before being active. °GET HELP IF: °· Your pain is not controlled with medicine. °· You cannot take less pain medicine over time as planned. °· Your activity level does not improve as expected. °GET HELP RIGHT AWAY IF:  °· You are bleeding. °· Your stomach is upset. °· You have an allergic reaction to your medicine. °· You develop new problems that you cannot explain. °· You lose feeling (become numb) or you cannot move any part of your body (paralysis). °· You have tingling or weakness in any part of your body. °· Your symptoms get worse. Symptoms include: °· Pain,  soreness, stiffness, puffiness (swelling), or a burning feeling in your neck. °· Pain when your neck is touched. °· Shoulder or upper back pain. °· Limited ability to move your neck. °· Headache. °· Dizziness. °· Your hands or arms feel week, lose feeling, or tingle. °· Muscle spasms. °· Difficulty swallowing or chewing. °MAKE SURE YOU:  °· Understand these instructions. °· Will watch your condition. °· Will get help right away if you are not doing well or get worse. °Document Released: 03/20/2008 Document Revised: 06/04/2013 Document Reviewed: 04/09/2013 °ExitCare® Patient Information ©2014 [employer]. ° °
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
## 2014-06-22 ENCOUNTER — Encounter (HOSPITAL_BASED_OUTPATIENT_CLINIC_OR_DEPARTMENT_OTHER): Payer: Self-pay | Admitting: Emergency Medicine

## 2014-06-22 ENCOUNTER — Emergency Department (HOSPITAL_BASED_OUTPATIENT_CLINIC_OR_DEPARTMENT_OTHER)
Admission: EM | Admit: 2014-06-22 | Discharge: 2014-06-23 | Disposition: A | Discharge status: Home / Self Care | Payer: No Typology Code available for payment source | Attending: Emergency Medicine | Admitting: Emergency Medicine

## 2014-06-22 DIAGNOSIS — D75839 Thrombocytosis, unspecified: Secondary | ICD-10-CM

## 2014-06-22 DIAGNOSIS — R5381 Other malaise: Secondary | ICD-10-CM | POA: Insufficient documentation

## 2014-06-22 DIAGNOSIS — Z792 Long term (current) use of antibiotics: Secondary | ICD-10-CM | POA: Insufficient documentation

## 2014-06-22 DIAGNOSIS — D696 Thrombocytopenia, unspecified: Secondary | ICD-10-CM | POA: Insufficient documentation

## 2014-06-22 DIAGNOSIS — R6883 Chills (without fever): Secondary | ICD-10-CM | POA: Insufficient documentation

## 2014-06-22 DIAGNOSIS — R52 Pain, unspecified: Secondary | ICD-10-CM | POA: Insufficient documentation

## 2014-06-22 DIAGNOSIS — I10 Essential (primary) hypertension: Secondary | ICD-10-CM | POA: Insufficient documentation

## 2014-06-22 DIAGNOSIS — R197 Diarrhea, unspecified: Secondary | ICD-10-CM | POA: Insufficient documentation

## 2014-06-22 DIAGNOSIS — R1013 Epigastric pain: Secondary | ICD-10-CM | POA: Insufficient documentation

## 2014-06-22 DIAGNOSIS — R3915 Urgency of urination: Secondary | ICD-10-CM | POA: Insufficient documentation

## 2014-06-22 DIAGNOSIS — D539 Nutritional anemia, unspecified: Secondary | ICD-10-CM | POA: Insufficient documentation

## 2014-06-22 DIAGNOSIS — D473 Essential (hemorrhagic) thrombocythemia: Secondary | ICD-10-CM

## 2014-06-22 DIAGNOSIS — R5383 Other fatigue: Secondary | ICD-10-CM

## 2014-06-22 DIAGNOSIS — D509 Iron deficiency anemia, unspecified: Secondary | ICD-10-CM

## 2014-06-22 DIAGNOSIS — R112 Nausea with vomiting, unspecified: Secondary | ICD-10-CM

## 2014-06-22 DIAGNOSIS — Z79899 Other long term (current) drug therapy: Secondary | ICD-10-CM | POA: Insufficient documentation

## 2014-06-22 LAB — CBC WITH DIFFERENTIAL/PLATELET
BASOS ABS: 0 10*3/uL (ref 0.0–0.1)
Basophils Relative: 0 % (ref 0–1)
EOS PCT: 1 % (ref 0–5)
Eosinophils Absolute: 0.1 10*3/uL (ref 0.0–0.7)
HCT: 30.5 % — ABNORMAL LOW (ref 36.0–46.0)
Hemoglobin: 9.6 g/dL — ABNORMAL LOW (ref 12.0–15.0)
LYMPHS ABS: 3 10*3/uL (ref 0.7–4.0)
LYMPHS PCT: 31 % (ref 12–46)
MCH: 23.6 pg — ABNORMAL LOW (ref 26.0–34.0)
MCHC: 31.5 g/dL (ref 30.0–36.0)
MCV: 75.1 fL — AB (ref 78.0–100.0)
Monocytes Absolute: 0.7 10*3/uL (ref 0.1–1.0)
Monocytes Relative: 8 % (ref 3–12)
NEUTROS ABS: 5.8 10*3/uL (ref 1.7–7.7)
Neutrophils Relative %: 60 % (ref 43–77)
PLATELETS: 502 10*3/uL — AB (ref 150–400)
RBC: 4.06 MIL/uL (ref 3.87–5.11)
RDW: 16.4 % — AB (ref 11.5–15.5)
WBC: 9.6 10*3/uL (ref 4.0–10.5)

## 2014-06-22 LAB — URINALYSIS, ROUTINE W REFLEX MICROSCOPIC
GLUCOSE, UA: NEGATIVE mg/dL
HGB URINE DIPSTICK: NEGATIVE
Ketones, ur: 15 mg/dL — AB
LEUKOCYTES UA: NEGATIVE
Nitrite: NEGATIVE
Protein, ur: NEGATIVE mg/dL
SPECIFIC GRAVITY, URINE: 1.026 (ref 1.005–1.030)
UROBILINOGEN UA: 2 mg/dL — AB (ref 0.0–1.0)
pH: 6 (ref 5.0–8.0)

## 2014-06-22 MED ORDER — ONDANSETRON HCL 4 MG/2ML IJ SOLN
4.0000 mg | Freq: Once | INTRAMUSCULAR | Status: AC
  Administered 2014-06-22: 4 mg via INTRAVENOUS
  Filled 2014-06-22: qty 2

## 2014-06-22 MED ORDER — LOPERAMIDE HCL 2 MG PO CAPS: 4.0000 mg | ORAL_CAPSULE | Freq: Once | ORAL | Status: DC

## 2014-06-22 MED ORDER — SODIUM CHLORIDE 0.9 % IV SOLN
1000.0000 mL | Freq: Once | INTRAVENOUS | Status: AC
  Administered 2014-06-22: 1000 mL via INTRAVENOUS

## 2014-06-22 MED ORDER — ACETAMINOPHEN 325 MG PO TABS
650.0000 mg | ORAL_TABLET | Freq: Once | ORAL | Status: AC
  Administered 2014-06-22: 650 mg via ORAL
  Filled 2014-06-22: qty 2

## 2014-06-22 MED ORDER — SODIUM CHLORIDE 0.9 % IV SOLN
1000.0000 mL | INTRAVENOUS | Status: DC
  Administered 2014-06-23: 1000 mL via INTRAVENOUS

## 2014-06-22 MED ORDER — GI COCKTAIL ~~LOC~~
30.0000 mL | Freq: Once | ORAL | Status: AC
  Administered 2014-06-22: 30 mL via ORAL
  Filled 2014-06-22: qty 30

## 2014-06-22 NOTE — ED Provider Notes (Signed)
CSN: [id number]     Arrival date & time 06/22/14  2221 History   First MD Initiated Contact with Patient 06/22/14 2309     This chart was scribed for Delora Fuel, MD by Edison Simon, ED Scribe. This patient was seen in room MH08/MH08 and the patient's care was started at 11:14 PM.   Chief Complaint  Patient presents with  . Generalized Body Aches   The history is provided by the patient. No language interpreter was used.    HPI Comments: Natasha Castro is a 33 y.o. female who presents to the Emergency Department complaining of body aches with onset upon waking this morning. She reports associated nausea, vomiting, diarrhea, generalized weakness, dehydration, fever, chills, and abdominal "burning" pain. She states she has not taken any medications to treat these symptoms. She states she went to work today but left when she saw she was running a fever. She reports particular aches to her back and below her ribs. She reports urinary urgency but decreased amount of urination. She reports recently being around someone sick with vomiting. She denies sweats.  Past Medical History  Diagnosis Date  . Hypertension   . Boil    Past Surgical History  Procedure Laterality Date  . Tubal ligation     No family history on file. History  Substance Use Topics  . Smoking status: Never Smoker   . Smokeless tobacco: Not on file  . Alcohol Use: No   OB History   Grav Para Term Preterm Abortions TAB SAB Ect Mult Living                 Review of Systems  Constitutional: Positive for chills and fatigue.       Positive body aches  Gastrointestinal: Positive for nausea, vomiting, abdominal pain ("burning") and diarrhea.  Genitourinary: Positive for urgency and decreased urine volume.  Skin:       Denies sweats  Neurological: Positive for weakness.  All other systems reviewed and are negative.     Allergies  Review of patient's allergies indicates no known allergies.  Home Medications   Prior  to Admission medications   Medication Sig Start Date End Date Taking? Authorizing Provider  Carvedilol (COREG PO) Take by mouth.    Historical Provider, MD  cephALEXin (KEFLEX) 500 MG capsule Take 1 capsule (500 mg total) by mouth 4 (four) times daily. 01/19/14   Veryl Speak, MD  cyclobenzaprine (FLEXERIL) 10 MG tablet Take 1 tablet (10 mg total) by mouth 2 (two) times daily as needed for muscle spasms. 02/23/14   Blanchie Dessert, MD  HYDROcodone-acetaminophen (NORCO) 5-325 MG per tablet Take 1 tablet by mouth every 6 (six) hours as needed. 12/13/13   April Alfonso Patten, MD  HYDROcodone-acetaminophen (NORCO) 5-325 MG per tablet Take 2 tablets by mouth every 4 (four) hours as needed. 01/19/14   Veryl Speak, MD  HYDROcodone-acetaminophen (NORCO/VICODIN) 5-325 MG per tablet Take 2 tablets by mouth every 4 (four) hours as needed. 08/25/13   Fransico Meadow, PA-C  HYDROcodone-acetaminophen (NORCO/VICODIN) 5-325 MG per tablet Take 1-2 tablets by mouth every 6 (six) hours as needed. 02/23/14   Blanchie Dessert, MD  ibuprofen (ADVIL,MOTRIN) 600 MG tablet Take 1 tablet (600 mg total) by mouth every 6 (six) hours as needed. 02/23/14   Blanchie Dessert, MD  phenazopyridine (PYRIDIUM) 200 MG tablet Take 1 tablet (200 mg total) by mouth 3 (three) times daily as needed for pain. 01/19/14   Veryl Speak, MD  sulfamethoxazole-trimethoprim [facility] DS)  800-160 MG per tablet Take 1 tablet by mouth 2 (two) times daily. 12/13/13   April K Palumbo-Rasch, MD   BP 153/92  Pulse 106  Temp(Src) 101.6 F (38.7 C) (Oral)  Resp 18  Ht 5\' 4"  (1.626 m)  Wt 162 lb (73.483 kg)  BMI 27.79 kg/m2  SpO2 100%  LMP 05/30/2014 Physical Exam  Nursing note and vitals reviewed. Constitutional: She is oriented to person, place, and time. She appears well-developed and well-nourished.  HENT:  Head: Normocephalic and atraumatic.  Eyes: Conjunctivae are normal. Pupils are equal, round, and reactive to light.  Neck: Normal range of motion.  Neck supple. No JVD present.  Cardiovascular: Normal rate, regular rhythm and normal heart sounds.   No murmur heard. Pulmonary/Chest: Effort normal and breath sounds normal. She has no wheezes. She has no rales.  Abdominal: Soft. She exhibits no distension and no mass. There is no tenderness.  Bowel sounds decreased  Musculoskeletal: Normal range of motion. She exhibits no edema.  Lymphadenopathy:    She has no cervical adenopathy.  Neurological: She is alert and oriented to person, place, and time. She has normal reflexes. No cranial nerve deficit. Coordination normal.  Skin: Skin is warm and dry. No rash noted.  Psychiatric: She has a normal mood and affect. Her behavior is normal. Thought content normal.    ED Course  Procedures (including critical care time) Labs Review Results for orders placed during the hospital encounter of 06/22/14  URINALYSIS, ROUTINE W REFLEX MICROSCOPIC      Result Value Ref Range   Color, Urine AMBER (*) YELLOW   APPearance CLOUDY (*) CLEAR   Specific Gravity, Urine 1.026  1.005 - 1.030   pH 6.0  5.0 - 8.0   Glucose, UA NEGATIVE  NEGATIVE mg/dL   Hgb urine dipstick NEGATIVE  NEGATIVE   Bilirubin Urine SMALL (*) NEGATIVE   Ketones, ur 15 (*) NEGATIVE mg/dL   Protein, ur NEGATIVE  NEGATIVE mg/dL   Urobilinogen, UA 2.0 (*) 0.0 - 1.0 mg/dL   Nitrite NEGATIVE  NEGATIVE   Leukocytes, UA NEGATIVE  NEGATIVE  CBC WITH DIFFERENTIAL      Result Value Ref Range   WBC 9.6  4.0 - 10.5 K/uL   RBC 4.06  3.87 - 5.11 MIL/uL   Hemoglobin 9.6 (*) 12.0 - 15.0 g/dL   HCT 30.5 (*) 36.0 - 46.0 %   MCV 75.1 (*) 78.0 - 100.0 fL   MCH 23.6 (*) 26.0 - 34.0 pg   MCHC 31.5  30.0 - 36.0 g/dL   RDW 16.4 (*) 11.5 - 15.5 %   Platelets 502 (*) 150 - 400 K/uL   Neutrophils Relative % 60  43 - 77 %   Neutro Abs 5.8  1.7 - 7.7 K/uL   Lymphocytes Relative 31  12 - 46 %   Lymphs Abs 3.0  0.7 - 4.0 K/uL   Monocytes Relative 8  3 - 12 %   Monocytes Absolute 0.7  0.1 - 1.0 K/uL    Eosinophils Relative 1  0 - 5 %   Eosinophils Absolute 0.1  0.0 - 0.7 K/uL   Basophils Relative 0  0 - 1 %   Basophils Absolute 0.0  0.0 - 0.1 K/uL  BASIC METABOLIC PANEL      Result Value Ref Range   Sodium 137  137 - 147 mEq/L   Potassium 3.2 (*) 3.7 - 5.3 mEq/L   Chloride 99  96 - 112 mEq/L   CO2 24  19 - 32 mEq/L   Glucose, Bld 99  70 - 99 mg/dL   BUN 6  6 - 23 mg/dL   Creatinine, Ser 0.50  0.50 - 1.10 mg/dL   Calcium 9.3  8.4 - 10.5 mg/dL   GFR calc non Af Amer >90  >90 mL/min   GFR calc Af Amer >90  >90 mL/min   Anion gap 14  5 - 15   DIAGNOSTIC STUDIES: Oxygen Saturation is 100% on room air, normal by my interpretation.    COORDINATION OF CARE:    MDM   Final diagnoses:  Nausea vomiting and diarrhea  Microcytic anemia  Thrombocytosis   Nausea, vomiting, diarrhea and pattern most consistent with viral gastroenteritis. She has had sick contacts with people with similar illness. She is treated with IV hydration and ondansetron for nausea. Because of epigastric burning, she is given a GI cocktail. Following this treatment, she feels much better. Laboratory workup was significant for anemia which is actually improved over baseline, and thrombocytosis which is unchanged from baseline. She is discharged with prescription for ondansetron and is advised to use over-the-counter H2 blockers as needed if she has recurrence of epigastric burning. Told to use loperamide as needed for diarrhea.  I personally performed the services described in this documentation, which was scribed in my presence. The recorded information has been reviewed and is accurate.     Delora Fuel, MD 37/62/83 1517

## 2014-06-22 NOTE — ED Notes (Signed)
PT presents to ED with complaints of generalized body aches and fever that started at 4pm today.

## 2014-06-23 LAB — BASIC METABOLIC PANEL
ANION GAP: 14 (ref 5–15)
BUN: 6 mg/dL (ref 6–23)
CALCIUM: 9.3 mg/dL (ref 8.4–10.5)
CHLORIDE: 99 meq/L (ref 96–112)
CO2: 24 meq/L (ref 19–32)
Creatinine, Ser: 0.5 mg/dL (ref 0.50–1.10)
GFR calc Af Amer: >90 mL/min (ref >90)
GFR calc non Af Amer: >90 mL/min (ref >90)
GLUCOSE: 99 mg/dL (ref 70–99)
POTASSIUM: 3.2 meq/L — AB (ref 3.7–5.3)
SODIUM: 137 meq/L (ref 137–147)

## 2014-06-23 MED ORDER — ONDANSETRON HCL 4 MG PO TABS: 4.0000 mg | ORAL_TABLET | Freq: Four times a day (QID) | ORAL | Status: DC | PRN

## 2014-06-23 NOTE — Discharge Instructions (Signed)
Take loperamide (Imodium AD) as needed for diarrhea.  Nausea and Vomiting Nausea is a sick feeling that often comes before throwing up (vomiting). Vomiting is a reflex where stomach contents come out of your mouth. Vomiting can cause severe loss of body fluids (dehydration). Children and elderly adults can become dehydrated quickly, especially if they also have diarrhea. Nausea and vomiting are symptoms of a condition or disease. It is important to find the cause of your symptoms. CAUSES   Direct irritation of the stomach lining. This irritation can result from increased acid production (gastroesophageal reflux disease), infection, food poisoning, taking certain medicines (such as nonsteroidal anti-inflammatory drugs), alcohol use, or tobacco use.  Signals from the brain.These signals could be caused by a headache, heat exposure, an inner ear disturbance, increased pressure in the brain from injury, infection, a tumor, or a concussion, pain, emotional stimulus, or metabolic problems.  An obstruction in the gastrointestinal tract (bowel obstruction).  Illnesses such as diabetes, hepatitis, gallbladder problems, appendicitis, kidney problems, cancer, sepsis, atypical symptoms of a heart attack, or eating disorders.  Medical treatments such as chemotherapy and radiation.  Receiving medicine that makes you sleep (general anesthetic) during surgery. DIAGNOSIS Your caregiver may ask for tests to be done if the problems do not improve after a few days. Tests may also be done if symptoms are severe or if the reason for the nausea and vomiting is not clear. Tests may include:  Urine tests.  Blood tests.  Stool tests.  Cultures (to look for evidence of infection).  X-rays or other imaging studies. Test results can help your caregiver make decisions about treatment or the need for additional tests. TREATMENT You need to stay well hydrated. Drink frequently but in small amounts.You may wish to  drink water, sports drinks, clear broth, or eat frozen ice pops or gelatin dessert to help stay hydrated.When you eat, eating slowly may help prevent nausea.There are also some antinausea medicines that may help prevent nausea. HOME CARE INSTRUCTIONS   Take all medicine as directed by your caregiver.  If you do not have an appetite, do not force yourself to eat. However, you must continue to drink fluids.  If you have an appetite, eat a normal diet unless your caregiver tells you differently.  Eat a variety of complex carbohydrates (rice, wheat, potatoes, bread), lean meats, yogurt, fruits, and vegetables.  Avoid high-fat foods because they are more difficult to digest.  Drink enough water and fluids to keep your urine clear or pale yellow.  If you are dehydrated, ask your caregiver for specific rehydration instructions. Signs of dehydration may include:  Severe thirst.  Dry lips and mouth.  Dizziness.  Dark urine.  Decreasing urine frequency and amount.  Confusion.  Rapid breathing or pulse. SEEK IMMEDIATE MEDICAL CARE IF:   You have blood or brown flecks (like coffee grounds) in your vomit.  You have black or bloody stools.  You have a severe headache or stiff neck.  You are confused.  You have severe abdominal pain.  You have chest pain or trouble breathing.  You do not urinate at least once every 8 hours.  You develop cold or clammy skin.  You continue to vomit for longer than 24 to 48 hours.  You have a fever. MAKE SURE YOU:   Understand these instructions.  Will watch your condition.  Will get help right away if you are not doing well or get worse. Document Released: 10/02/2005 Document Revised: 12/25/2011 Document Reviewed: 03/01/2011 ExitCare Patient  Information ©2015 [employer]. This information is not intended to replace advice given to you by your health care provider. Make sure you discuss any questions you have with your health care  provider. ° °Diarrhea °Diarrhea is frequent loose and watery bowel movements. It can cause you to feel weak and dehydrated. Dehydration can cause you to become tired and thirsty, have a dry mouth, and have decreased urination that often is dark yellow. Diarrhea is a sign of another problem, most often an infection that will not last long. In most cases, diarrhea typically lasts 2-3 days. However, it can last longer if it is a sign of something more serious. It is important to treat your diarrhea as directed by your caregiver to lessen or prevent future episodes of diarrhea. °CAUSES  °Some common causes include: °· Gastrointestinal infections caused by viruses, bacteria, or parasites. °· Food poisoning or food allergies. °· Certain medicines, such as antibiotics, chemotherapy, and laxatives. °· Artificial sweeteners and fructose. °· Digestive disorders. °HOME CARE INSTRUCTIONS °· Ensure adequate fluid intake (hydration): Have 1 cup (8 oz) of fluid for each diarrhea episode. Avoid fluids that contain simple sugars or sports drinks, fruit juices, whole milk products, and sodas. Your urine should be clear or pale yellow if you are drinking enough fluids. Hydrate with an oral rehydration solution that you can purchase at pharmacies, retail stores, and online. You can prepare an oral rehydration solution at home by mixing the following ingredients together: °¨  - tsp table salt. °¨ ¾ tsp baking soda. °¨  tsp salt substitute containing potassium chloride. °¨ 1  tablespoons sugar. °¨ 1 L (34 oz) of water. °· Certain foods and beverages may increase the speed at which food moves through the gastrointestinal (GI) tract. These foods and beverages should be avoided and include: °¨ Caffeinated and alcoholic beverages. °¨ High-fiber foods, such as raw fruits and vegetables, nuts, seeds, and whole grain breads and cereals. °¨ Foods and beverages sweetened with sugar alcohols, such as xylitol, sorbitol, and mannitol. °· Some foods  may be well tolerated and may help thicken stool including: °¨ Starchy foods, such as rice, toast, pasta, low-sugar cereal, oatmeal, grits, baked potatoes, crackers, and bagels. °¨ Bananas. °¨ Applesauce. °· Add probiotic-rich foods to help increase healthy bacteria in the GI tract, such as yogurt and fermented milk products. °· Wash your hands well after each diarrhea episode. °· Only take over-the-counter or prescription medicines as directed by your caregiver. °· Take a warm bath to relieve any burning or pain from frequent diarrhea episodes. °SEEK IMMEDIATE MEDICAL CARE IF:  °· You are unable to keep fluids down. °· You have persistent vomiting. °· You have blood in your stool, or your stools are black and tarry. °· You do not urinate in 6-8 hours, or there is only a small amount of very dark urine. °· You have abdominal pain that increases or localizes. °· You have weakness, dizziness, confusion, or light-headedness. °· You have a severe headache. °· Your diarrhea gets worse or does not get better. °· You have a fever or persistent symptoms for more than 2-3 days. °· You have a fever and your symptoms suddenly get worse. °MAKE SURE YOU:  °· Understand these instructions. °· Will watch your condition. °· Will get help right away if you are not doing well or get worse. °Document Released: 09/22/2002 Document Revised: 02/16/2014 Document Reviewed: 06/09/2012 °ExitCare® Patient Information ©2015 [employer]. This information is not intended to replace advice given to you   by your health care provider. Make sure you discuss any questions you have with your health care provider.  Ondansetron tablets What is this medicine? ONDANSETRON (on DAN se tron) is used to treat nausea and vomiting caused by chemotherapy. It is also used to prevent or treat nausea and vomiting after surgery. This medicine may be used for other purposes; ask your health care provider or pharmacist if you have questions. COMMON BRAND  NAME(S): Zofran What should I tell my health care provider before I take this medicine? They need to know if you have any of these conditions: -heart disease -history of irregular heartbeat -liver disease -low levels of magnesium or potassium in the blood -an unusual or allergic reaction to ondansetron, granisetron, other medicines, foods, dyes, or preservatives -pregnant or trying to get pregnant -breast-feeding How should I use this medicine? Take this medicine by mouth with a glass of water. Follow the directions on your prescription label. Take your doses at regular intervals. Do not take your medicine more often than directed. Talk to your pediatrician regarding the use of this medicine in children. Special care may be needed. Overdosage: If you think you have taken too much of this medicine contact a poison control center or emergency room at once. NOTE: This medicine is only for you. Do not share this medicine with others. What if I miss a dose? If you miss a dose, take it as soon as you can. If it is almost time for your next dose, take only that dose. Do not take double or extra doses. What may interact with this medicine? Do not take this medicine with any of the following medications: -apomorphine -certain medicines for fungal infections like fluconazole, itraconazole, ketoconazole, posaconazole, voriconazole -cisapride -dofetilide -dronedarone -pimozide -thioridazine -ziprasidone This medicine may also interact with the following medications: -carbamazepine -certain medicines for depression, anxiety, or psychotic disturbances -fentanyl -linezolid -MAOIs like Carbex, Eldepryl, Marplan, Nardil, and Parnate -methylene blue (injected into a vein) -other medicines that prolong the QT interval (cause an abnormal heart rhythm) -phenytoin -rifampicin -tramadol This list may not describe all possible interactions. Give your health care provider a list of all the medicines,  herbs, non-prescription drugs, or dietary supplements you use. Also tell them if you smoke, drink alcohol, or use illegal drugs. Some items may interact with your medicine. What should I watch for while using this medicine? Check with your doctor or health care professional right away if you have any sign of an allergic reaction. What side effects may I notice from receiving this medicine? Side effects that you should report to your doctor or health care professional as soon as possible: -allergic reactions like skin rash, itching or hives, swelling of the face, lips or tongue -breathing problems -confusion -dizziness -fast or irregular heartbeat -feeling faint or lightheaded, falls -fever and chills -loss of balance or coordination -seizures -sweating -swelling of the hands or feet -tightness in the chest -tremors -unusually weak or tired Side effects that usually do not require medical attention (report to your doctor or health care professional if they continue or are bothersome): -constipation or diarrhea -headache This list may not describe all possible side effects. Call your doctor for medical advice about side effects. You may report side effects to FDA at 1-800-FDA-1088. Where should I keep my medicine? Keep out of the reach of children. Store between 2 and 30 degrees C (36 and 86 degrees F). Throw away any unused medicine after the expiration date. NOTE: This sheet is a  summary. It may not cover all possible information. If you have questions about this medicine, talk to your doctor, pharmacist, or health care provider.  2015, [employer]. (2013-07-09 16:27:45)

## 2014-06-23 NOTE — ED Notes (Signed)
MD at bedside. 

## 2014-07-07 ENCOUNTER — Encounter (HOSPITAL_BASED_OUTPATIENT_CLINIC_OR_DEPARTMENT_OTHER): Payer: Self-pay | Admitting: Emergency Medicine

## 2014-07-07 ENCOUNTER — Emergency Department (HOSPITAL_BASED_OUTPATIENT_CLINIC_OR_DEPARTMENT_OTHER)
Admission: EM | Admit: 2014-07-07 | Discharge: 2014-07-07 | Disposition: A | Discharge status: Home / Self Care | Payer: No Typology Code available for payment source | Attending: Emergency Medicine | Admitting: Emergency Medicine

## 2014-07-07 DIAGNOSIS — Z792 Long term (current) use of antibiotics: Secondary | ICD-10-CM | POA: Insufficient documentation

## 2014-07-07 DIAGNOSIS — Z872 Personal history of diseases of the skin and subcutaneous tissue: Secondary | ICD-10-CM | POA: Insufficient documentation

## 2014-07-07 DIAGNOSIS — I10 Essential (primary) hypertension: Secondary | ICD-10-CM | POA: Insufficient documentation

## 2014-07-07 DIAGNOSIS — Z79899 Other long term (current) drug therapy: Secondary | ICD-10-CM | POA: Insufficient documentation

## 2014-07-07 DIAGNOSIS — L02411 Cutaneous abscess of right axilla: Secondary | ICD-10-CM

## 2014-07-07 DIAGNOSIS — IMO0002 Reserved for concepts with insufficient information to code with codable children: Secondary | ICD-10-CM | POA: Insufficient documentation

## 2014-07-07 MED ORDER — IBUPROFEN 800 MG PO TABS
800.0000 mg | ORAL_TABLET | Freq: Once | ORAL | Status: AC
  Administered 2014-07-07: 800 mg via ORAL
  Filled 2014-07-07: qty 1

## 2014-07-07 MED ORDER — HYDROCODONE-ACETAMINOPHEN 5-325 MG PO TABS: 1.0000 | ORAL_TABLET | ORAL | Status: DC | PRN

## 2014-07-07 MED ORDER — HYDROCODONE-ACETAMINOPHEN 5-325 MG PO TABS
2.0000 | ORAL_TABLET | Freq: Once | ORAL | Status: AC
  Administered 2014-07-07: 2 via ORAL
  Filled 2014-07-07: qty 2

## 2014-07-07 NOTE — Discharge Instructions (Signed)
Abscess °An abscess is an infected area that contains a collection of pus and debris. It can occur in almost any part of the body. An abscess is also known as a furuncle or boil. °CAUSES  °An abscess occurs when tissue gets infected. This can occur from blockage of oil or sweat glands, infection of hair follicles, or a minor injury to the skin. As the body tries to fight the infection, pus collects in the area and creates pressure under the skin. This pressure causes pain. People with weakened immune systems have difficulty fighting infections and get certain abscesses more often.  °SYMPTOMS °Usually an abscess develops on the skin and becomes a painful mass that is red, warm, and tender. If the abscess forms under the skin, you may feel a moveable soft area under the skin. Some abscesses break open (rupture) on their own, but most will continue to get worse without care. The infection can spread deeper into the body and eventually into the bloodstream, causing you to feel ill.  °DIAGNOSIS  °Your caregiver will take your medical history and perform a physical exam. A sample of fluid may also be taken from the abscess to determine what is causing your infection. °TREATMENT  °Your caregiver may prescribe antibiotic medicines to fight the infection. However, taking antibiotics alone usually does not cure an abscess. Your caregiver may need to make a small cut (incision) in the abscess to drain the pus. In some cases, gauze is packed into the abscess to reduce pain and to continue draining the area. °HOME CARE INSTRUCTIONS  °· Only take over-the-counter or prescription medicines for pain, discomfort, or fever as directed by your caregiver. °· If you were prescribed antibiotics, take them as directed. Finish them even if you start to feel better. °· If gauze is used, follow your caregiver's directions for changing the gauze. °· To avoid spreading the infection: °· Keep your draining abscess covered with a  bandage. °· Wash your hands well. °· Do not share personal care items, towels, or whirlpools with others. °· Avoid skin contact with others. °· Keep your skin and clothes clean around the abscess. °· Keep all follow-up appointments as directed by your caregiver. °SEEK MEDICAL CARE IF:  °· You have increased pain, swelling, redness, fluid drainage, or bleeding. °· You have muscle aches, chills, or a general ill feeling. °· You have a fever. °MAKE SURE YOU:  °· Understand these instructions. °· Will watch your condition. °· Will get help right away if you are not doing well or get worse. °Document Released: 07/12/2005 Document Revised: 04/02/2012 Document Reviewed: 12/15/2011 °Exit[employer]® Patient Information ©2015 [employer]. This information is not intended to replace advice given to you by your health care provider. Make sure you discuss any questions you have with your health care provider. ° °Abscess °Care After °An abscess (also called a boil or furuncle) is an infected area that contains a collection of pus. Signs and symptoms of an abscess include pain, tenderness, redness, or hardness, or you may feel a moveable soft area under your skin. An abscess can occur anywhere in the body. The infection may spread to surrounding tissues causing cellulitis. A cut (incision) by the surgeon was made over your abscess and the pus was drained out. Gauze may have been packed into the space to provide a drain that will allow the cavity to heal from the inside outwards. The boil may be painful for 5 to 7 days. Most people with a boil do not have   high fevers. Your abscess, if seen early, may not have localized, and may not have been lanced. If not, another appointment may be required for this if it does not get better on its own or with medications. °HOME CARE INSTRUCTIONS  °· Only take over-the-counter or prescription medicines for pain, discomfort, or fever as directed by your caregiver. °· When you bathe, soak and then  remove gauze or iodoform packs at least daily or as directed by your caregiver. You may then wash the wound gently with mild soapy water. Repack with gauze or do as your caregiver directs. °SEEK IMMEDIATE MEDICAL CARE IF:  °· You develop increased pain, swelling, redness, drainage, or bleeding in the wound site. °· You develop signs of generalized infection including muscle aches, chills, fever, or a general ill feeling. °· An oral temperature above 102° F (38.9° C) develops, not controlled by medication. °See your caregiver for a recheck if you develop any of the symptoms described above. If medications (antibiotics) were prescribed, take them as directed. °Document Released: 04/20/2005 Document Revised: 12/25/2011 Document Reviewed: 12/16/2007 °Exit[employer]® Patient Information ©2015 [employer]. This information is not intended to replace advice given to you by your health care provider. Make sure you discuss any questions you have with your health care provider. ° °

## 2014-07-07 NOTE — ED Provider Notes (Signed)
TIME SEEN: 10:45 AM  CHIEF COMPLAINT: Right axillary abscess  HPI: Patient is a 33 y.o. F with history of hypertension and prior right axillary abscess who presents emergency department with a right axillary abscess for one week. She denies any fevers, chills, nausea, vomiting or diarrhea. She is not a diabetic or immunocompromised. No history of hidradenitis. No drainage from the abscess. She has tried multiple home remedies including using fat back and egg shells without relief.  ROS: See HPI Constitutional: no fever  Eyes: no drainage  ENT: no runny nose   Cardiovascular:  no chest pain  Resp: no SOB  GI: no vomiting GU: no dysuria Integumentary: no rash  Allergy: no hives  Musculoskeletal: no leg swelling  Neurological: no slurred speech ROS otherwise negative  PAST MEDICAL HISTORY/PAST SURGICAL HISTORY:  Past Medical History  Diagnosis Date  . Hypertension   . Boil     MEDICATIONS:  Prior to Admission medications   Medication Sig Start Date End Date Taking? Authorizing Provider  Carvedilol (COREG PO) Take by mouth.   Yes Historical Provider, MD  ibuprofen (ADVIL,MOTRIN) 600 MG tablet Take 1 tablet (600 mg total) by mouth every 6 (six) hours as needed. 02/23/14  Yes Blanchie Dessert, MD  cephALEXin (KEFLEX) 500 MG capsule Take 1 capsule (500 mg total) by mouth 4 (four) times daily. 01/19/14   Veryl Speak, MD  cyclobenzaprine (FLEXERIL) 10 MG tablet Take 1 tablet (10 mg total) by mouth 2 (two) times daily as needed for muscle spasms. 02/23/14   Blanchie Dessert, MD  HYDROcodone-acetaminophen (NORCO) 5-325 MG per tablet Take 1 tablet by mouth every 6 (six) hours as needed. 12/13/13   April Alfonso Patten, MD  HYDROcodone-acetaminophen (NORCO) 5-325 MG per tablet Take 2 tablets by mouth every 4 (four) hours as needed. 01/19/14   Veryl Speak, MD  HYDROcodone-acetaminophen (NORCO/VICODIN) 5-325 MG per tablet Take 2 tablets by mouth every 4 (four) hours as needed. 08/25/13   Fransico Meadow, PA-C  HYDROcodone-acetaminophen (NORCO/VICODIN) 5-325 MG per tablet Take 1-2 tablets by mouth every 6 (six) hours as needed. 02/23/14   Blanchie Dessert, MD  ondansetron (ZOFRAN) 4 MG tablet Take 1 tablet (4 mg total) by mouth every 6 (six) hours as needed for nausea or vomiting. 01/15/27   Delora Fuel, MD  phenazopyridine (PYRIDIUM) 200 MG tablet Take 1 tablet (200 mg total) by mouth 3 (three) times daily as needed for pain. 01/19/14   Veryl Speak, MD  sulfamethoxazole-trimethoprim (SEPTRA DS) 800-160 MG per tablet Take 1 tablet by mouth 2 (two) times daily. 12/13/13   April Alfonso Patten, MD    ALLERGIES:  No Known Allergies  SOCIAL HISTORY:  History  Substance Use Topics  . Smoking status: Never Smoker   . Smokeless tobacco: Not on file  . Alcohol Use: No    FAMILY HISTORY: No family history on file.  EXAM: BP 144/82  Pulse 80  Temp(Src) 98.7 F (37.1 C) (Oral)  Resp 18  Ht 5\' 4"  (1.626 m)  Wt 162 lb (73.483 kg)  BMI 27.79 kg/m2  SpO2 100%  LMP 05/28/2014 CONSTITUTIONAL: Alert and oriented and responds appropriately to questions. Well-appearing; well-nourished HEAD: Normocephalic EYES: Conjunctivae clear, PERRL ENT: normal nose; no rhinorrhea; moist mucous membranes; pharynx without lesions noted NECK: Supple, no meningismus, no LAD  CARD: RRR; S1 and S2 appreciated; no murmurs, no clicks, no rubs, no gallops RESP: Normal chest excursion without splinting or tachypnea; breath sounds clear and equal bilaterally; no wheezes, no rhonchi,  no rales,  ABD/GI: Normal bowel sounds; non-distended; soft, non-tender, no rebound, no guarding BACK:  The back appears normal and is non-tender to palpation, there is no CVA tenderness EXT: Patient has a 3 x 4 cm fluctuant area to her right axilla without surrounding erythema or warmth or induration, compartments are soft, 2+ radial pulses bilaterally, sensation to light touch intact diffusely, Normal ROM in all joints; otherwise  extremities are non-tender to palpation; no edema; normal capillary refill; no cyanosis    SKIN: Normal color for age and race; warm NEURO: Moves all extremities equally PSYCH: The patient's mood and manner are appropriate. Grooming and personal hygiene are appropriate.  MEDICAL DECISION MAKING: Patient here with a right axillary abscess. I have incised and drained abscess with a large amount of purulent drainage. She is no surrounding signs of cellulitis. I do not feel she needs to be on antibiotics. Have discussed with her supportive care structures including warm compresses and soaks. We'll discharge with Vicodin and ibuprofen for pain control as needed. Discussed return precautions. She verbalized understanding and is comfortable with plan.     INCISION AND DRAINAGE Performed by: Nyra Jabs Consent: Verbal consent obtained. Risks and benefits: risks, benefits and alternatives were discussed Type: abscess  Body area: Right axilla  Anesthesia: local infiltration  Incision was made with a scalpel.  Local anesthetic: lidocaine 2 % with epinephrine  Anesthetic total: 5 ml  Complexity: complex Blunt dissection to break up loculations  Drainage: purulent  Drainage amount: Large   Patient tolerance: Patient tolerated the procedure well with no immediate complications.      [place], DO 07/07/14 1112

## 2014-07-07 NOTE — ED Notes (Signed)
Directed to pharmacy to pick up rx- pt verbalizes understanding of wound care and dressing change- note for work given

## 2014-07-07 NOTE — ED Notes (Signed)
Abscess on right axillary area for one week.  Painful, red and warm to touch.  Using warm compresses with minimal relief.

## 2014-12-31 ENCOUNTER — Encounter (HOSPITAL_BASED_OUTPATIENT_CLINIC_OR_DEPARTMENT_OTHER): Payer: Self-pay | Admitting: *Deleted

## 2014-12-31 ENCOUNTER — Emergency Department (HOSPITAL_BASED_OUTPATIENT_CLINIC_OR_DEPARTMENT_OTHER)
Admission: EM | Admit: 2014-12-31 | Discharge: 2014-12-31 | Disposition: A | Discharge status: Home / Self Care | Payer: Medicaid Other | Attending: Emergency Medicine | Admitting: Emergency Medicine

## 2014-12-31 DIAGNOSIS — M545 Low back pain: Secondary | ICD-10-CM

## 2014-12-31 DIAGNOSIS — I10 Essential (primary) hypertension: Secondary | ICD-10-CM | POA: Insufficient documentation

## 2014-12-31 DIAGNOSIS — Z3202 Encounter for pregnancy test, result negative: Secondary | ICD-10-CM | POA: Insufficient documentation

## 2014-12-31 DIAGNOSIS — Z79899 Other long term (current) drug therapy: Secondary | ICD-10-CM | POA: Insufficient documentation

## 2014-12-31 DIAGNOSIS — R300 Dysuria: Secondary | ICD-10-CM | POA: Insufficient documentation

## 2014-12-31 DIAGNOSIS — Z872 Personal history of diseases of the skin and subcutaneous tissue: Secondary | ICD-10-CM | POA: Insufficient documentation

## 2014-12-31 DIAGNOSIS — Z792 Long term (current) use of antibiotics: Secondary | ICD-10-CM | POA: Insufficient documentation

## 2014-12-31 LAB — URINALYSIS, ROUTINE W REFLEX MICROSCOPIC
BILIRUBIN URINE: NEGATIVE
Glucose, UA: NEGATIVE mg/dL
HGB URINE DIPSTICK: NEGATIVE
Ketones, ur: NEGATIVE mg/dL
Leukocytes, UA: NEGATIVE
Nitrite: NEGATIVE
Protein, ur: NEGATIVE mg/dL
SPECIFIC GRAVITY, URINE: 1.019 (ref 1.005–1.030)
Urobilinogen, UA: 1 mg/dL (ref 0.0–1.0)
pH: 6.5 (ref 5.0–8.0)

## 2014-12-31 LAB — WET PREP, GENITAL
TRICH WET PREP: NONE SEEN
YEAST WET PREP: NONE SEEN

## 2014-12-31 LAB — PREGNANCY, URINE: PREG TEST UR: NEGATIVE

## 2014-12-31 MED ORDER — CEPHALEXIN 500 MG PO CAPS: 500.0000 mg | ORAL_CAPSULE | Freq: Four times a day (QID) | ORAL | Status: DC

## 2014-12-31 MED ORDER — IBUPROFEN 800 MG PO TABS
800.0000 mg | ORAL_TABLET | Freq: Once | ORAL | Status: AC
  Administered 2014-12-31: 800 mg via ORAL
  Filled 2014-12-31: qty 1

## 2014-12-31 MED ORDER — ACETAMINOPHEN 500 MG PO TABS
500.0000 mg | ORAL_TABLET | Freq: Four times a day (QID) | ORAL | Status: DC | PRN
Start: 2014-12-31 — End: 2020-03-31

## 2014-12-31 NOTE — Discharge Instructions (Signed)

## 2014-12-31 NOTE — ED Notes (Signed)
PA at bedside.

## 2014-12-31 NOTE — ED Provider Notes (Signed)
CSN: [id number]     Arrival date & time 12/31/14  1143 History   First MD Initiated Contact with Patient 12/31/14 1259     Chief Complaint  Patient presents with  . Dysuria     (Consider location/radiation/quality/duration/timing/severity/associated sxs/prior Treatment) HPI   34 year old female presents for evaluation of dysuria. Patient reports 4 days ago she developed gradual onset of low abdominal pain that radiates to her low back. She also endorsed having dysuria with sensation of urinary urgency and frequency with burning on urination. States that she also notice small amount of blood in her urine. Complaining of throbbing sensation to the pelvic region after urinating. The pain is gone progressively worse. Increasing pain when she stands up straight and improves when she is in a fetal position. Endorse nausea without vomiting or diarrhea. Patient felt symptoms seem similar to prior UTI therefore she has been drinking a lot of water, cranberry juice, and vitamin C however this time she reported no improvement. She denies having any fever, chills, chest pain, shortness of breath, vaginal discharge, rectal pain, difficulty having bowel movement, or constipation. No prior history of STD. Last sexual activities was 2 weeks ago, using protection. Patient has prior tubal ligation.  Past Medical History  Diagnosis Date  . Hypertension   . Boil    Past Surgical History  Procedure Laterality Date  . Tubal ligation     No family history on file. History  Substance Use Topics  . Smoking status: Never Smoker   . Smokeless tobacco: Not on file  . Alcohol Use: No   OB History    No data available     Review of Systems  All other systems reviewed and are negative.     Allergies  Review of patient's allergies indicates no known allergies.  Home Medications   Prior to Admission medications   Medication Sig Start Date End Date Taking? Authorizing Provider  Carvedilol (COREG PO)  Take by mouth.    Historical Provider, MD  cephALEXin (KEFLEX) 500 MG capsule Take 1 capsule (500 mg total) by mouth 4 (four) times daily. 01/19/14   Veryl Speak, MD  cyclobenzaprine (FLEXERIL) 10 MG tablet Take 1 tablet (10 mg total) by mouth 2 (two) times daily as needed for muscle spasms. 02/23/14   Blanchie Dessert, MD  HYDROcodone-acetaminophen (NORCO) 5-325 MG per tablet Take 1 tablet by mouth every 6 (six) hours as needed. 12/13/13   April Palumbo, MD  HYDROcodone-acetaminophen (NORCO) 5-325 MG per tablet Take 2 tablets by mouth every 4 (four) hours as needed. 01/19/14   Veryl Speak, MD  HYDROcodone-acetaminophen (NORCO/VICODIN) 5-325 MG per tablet Take 2 tablets by mouth every 4 (four) hours as needed. 08/25/13   Fransico Meadow, PA-C  HYDROcodone-acetaminophen (NORCO/VICODIN) 5-325 MG per tablet Take 1-2 tablets by mouth every 6 (six) hours as needed. 02/23/14   Blanchie Dessert, MD  HYDROcodone-acetaminophen (NORCO/VICODIN) 5-325 MG per tablet Take 1 tablet by mouth every 4 (four) hours as needed. 07/07/14   Kristen N Ward, DO  ibuprofen (ADVIL,MOTRIN) 600 MG tablet Take 1 tablet (600 mg total) by mouth every 6 (six) hours as needed. 02/23/14   Blanchie Dessert, MD  ondansetron (ZOFRAN) 4 MG tablet Take 1 tablet (4 mg total) by mouth every 6 (six) hours as needed for nausea or vomiting. 04/20/18   Delora Fuel, MD  phenazopyridine (PYRIDIUM) 200 MG tablet Take 1 tablet (200 mg total) by mouth 3 (three) times daily as needed for pain. 01/19/14   Nathaneil Canary  [place], MD  sulfamethoxazole-trimethoprim (SEPTRA DS) 800-160 MG per tablet Take 1 tablet by mouth 2 (two) times daily. 12/13/13   April Palumbo, MD   BP 170/98 mmHg  Pulse 92  Temp(Src) 98.6 F (37 C) (Oral)  Resp 16  Ht 5\' 4"  (1.626 m)  Wt 162 lb (73.483 kg)  BMI 27.79 kg/m2  SpO2 100%  LMP 12/16/2014 Physical Exam  Constitutional:  Awake, alert, nontoxic appearance  HENT:  Head: Atraumatic.  Eyes: Right eye exhibits no discharge. Left eye  exhibits no discharge.  Neck: Neck supple.  Cardiovascular: Normal rate and regular rhythm.   Pulmonary/Chest: Effort normal. She exhibits no tenderness.  Abdominal: There is no tenderness (suprapubic tenderness on palpation without guarding or rebound tenderness. Negative Murphy sign, no pain at McBurney's point, no peritoneal sign.). There is no rebound.  Genitourinary:  Chaperone present during exam. Patient has low back tenderness on percussion but no CVA tenderness.  No inguinal lymphadenopathy or inguinal hernia noted. Normal external genitalia. No pain with speculum insertion. Cervical os is visualized, and closed. Functional vaginal discharge noted in vaginal vault. On bimanual examination, discomfort noted to both left and right adnexal region as well as cervical motion tenderness.    Musculoskeletal: She exhibits no tenderness.  Skin: No rash noted.  Psychiatric: She has a normal mood and affect.  Nursing note and vitals reviewed.   ED Course  Procedures (including critical care time)  Patient presents with dysuria accompanied with low back pain and no abdominal pain. She has discomfort to both left and right adnexal and cervical motion tenderness on pelvic exam. Her urine however shows no evidence of urinary tract infection, pregnancy test is negative.  I did offer to provide prophylactic treatment for suspect STD with Rocephin and Zithromax however patient declined.  2:47 PM Wet prep result is unremarkable, UA is unremarkable. Patient endorse most of the pain is with urinating and with low back. She denies any specific back injury therefore advanced imaging is not indicated. She has a nonsurgical abdomen therefore low suspicion for appendicitis, colitis, or other acute medical condition. Since patient is symptomatic with a dysuria, I will treat for suspect urinary tract infection with Keflex. Urine culture sent. Recommend ibuprofen or Tylenol for pain. Patient will follow-up  closely with her primary care Dr. for further care. Return precautions discussed.  Labs Review Labs Reviewed  WET PREP, GENITAL - Abnormal; Notable for the following:    Clue Cells Wet Prep HPF POC FEW (*)    WBC, Wet Prep HPF POC FEW (*)    All other components within normal limits  URINALYSIS, ROUTINE W REFLEX MICROSCOPIC - Abnormal; Notable for the following:    APPearance CLOUDY (*)    All other components within normal limits  URINE CULTURE  PREGNANCY, URINE  RPR  HIV ANTIBODY (ROUTINE TESTING)  GC/CHLAMYDIA PROBE AMP ([place])    Imaging Review No results found.   EKG Interpretation None      MDM   Final diagnoses:  Dysuria  Low back pain without sciatica, unspecified back pain laterality    BP 170/98 mmHg  Pulse 92  Temp(Src) 98.6 F (37 C) (Oral)  Resp 16  Ht 5\' 4"  (1.626 m)  Wt 162 lb (73.483 kg)  BMI 27.79 kg/m2  SpO2 100%  LMP 12/16/2014 BP elevated, will recheck.  BP 150/93 mmHg  Pulse 80  Temp(Src) 98.5 F (36.9 C) (Oral)  Resp 18  Ht 5\' 4"  (1.626 m)  Wt 162 lb (73.483  kg)  BMI 27.79 kg/m2  SpO2 100%  LMP 12/16/2014 Recommend having BP recheck by PCP.      Domenic Moras, PA-C 12/31/14 [place], MD 01/01/15 [phone]

## 2014-12-31 NOTE — ED Notes (Signed)
Pt c/o bilat lower back pain radiating around to lower abd and painful urination.

## 2015-01-01 LAB — GC/CHLAMYDIA PROBE AMP (~~LOC~~) NOT AT ARMC
Chlamydia: NEGATIVE
Neisseria Gonorrhea: NEGATIVE

## 2015-01-01 LAB — RPR: RPR: NONREACTIVE

## 2015-01-01 LAB — HIV ANTIBODY (ROUTINE TESTING W REFLEX): HIV Screen 4th Generation wRfx: NONREACTIVE

## 2015-01-04 LAB — URINE CULTURE: Colony Count: >=100000

## 2015-01-05 ENCOUNTER — Telehealth (HOSPITAL_BASED_OUTPATIENT_CLINIC_OR_DEPARTMENT_OTHER): Payer: Self-pay | Admitting: Emergency Medicine

## 2015-01-05 NOTE — Telephone Encounter (Signed)
Post ED Visit - Positive Culture Follow-up  Culture report reviewed by antimicrobial stewardship pharmacist: []  Wes [place], Pharm.D., BCPS []  Heide Guile, Pharm.D., BCPS []  Alycia Rossetti, Pharm.D., BCPS []  [place], Pharm.D., BCPS, AAHIVP [x]  Legrand [place]o, Pharm.D., BCPS, AAHIVP []  Isac Sarna, Pharm.D., BCPS  Positive urine culture E. Coli Treated with cephalexin, organism sensitive to the same and no further patient follow-up is required at this time.  Hazle Nordmann 01/05/2015, 11:23 AM

## 2015-01-27 ENCOUNTER — Emergency Department (HOSPITAL_BASED_OUTPATIENT_CLINIC_OR_DEPARTMENT_OTHER)
Admission: EM | Admit: 2015-01-27 | Discharge: 2015-01-27 | Disposition: A | Discharge status: Home / Self Care | Payer: Self-pay | Attending: Emergency Medicine | Admitting: Emergency Medicine

## 2015-01-27 ENCOUNTER — Encounter (HOSPITAL_BASED_OUTPATIENT_CLINIC_OR_DEPARTMENT_OTHER): Payer: Self-pay | Admitting: *Deleted

## 2015-01-27 ENCOUNTER — Emergency Department (HOSPITAL_BASED_OUTPATIENT_CLINIC_OR_DEPARTMENT_OTHER): Payer: Medicaid Other

## 2015-01-27 DIAGNOSIS — I159 Secondary hypertension, unspecified: Secondary | ICD-10-CM | POA: Insufficient documentation

## 2015-01-27 DIAGNOSIS — R519 Headache, unspecified: Secondary | ICD-10-CM

## 2015-01-27 DIAGNOSIS — H53149 Visual discomfort, unspecified: Secondary | ICD-10-CM | POA: Insufficient documentation

## 2015-01-27 DIAGNOSIS — Z872 Personal history of diseases of the skin and subcutaneous tissue: Secondary | ICD-10-CM | POA: Insufficient documentation

## 2015-01-27 DIAGNOSIS — M542 Cervicalgia: Secondary | ICD-10-CM | POA: Insufficient documentation

## 2015-01-27 DIAGNOSIS — R51 Headache: Secondary | ICD-10-CM | POA: Insufficient documentation

## 2015-01-27 LAB — BASIC METABOLIC PANEL
Anion gap: 9 (ref 5–15)
BUN: 7 mg/dL (ref 6–23)
CHLORIDE: 105 mmol/L (ref 96–112)
CO2: 26 mmol/L (ref 19–32)
Calcium: 8.7 mg/dL (ref 8.4–10.5)
Creatinine, Ser: 0.5 mg/dL (ref 0.50–1.10)
Glucose, Bld: 93 mg/dL (ref 70–99)
POTASSIUM: 3.3 mmol/L — AB (ref 3.5–5.1)
SODIUM: 140 mmol/L (ref 135–145)

## 2015-01-27 MED ORDER — METOCLOPRAMIDE HCL 5 MG/ML IJ SOLN
10.0000 mg | Freq: Once | INTRAMUSCULAR | Status: AC
  Administered 2015-01-27: 10 mg via INTRAVENOUS
  Filled 2015-01-27: qty 2

## 2015-01-27 MED ORDER — SODIUM CHLORIDE 0.9 % IV BOLUS (SEPSIS)
1000.0000 mL | Freq: Once | INTRAVENOUS | Status: AC
  Administered 2015-01-27: 1000 mL via INTRAVENOUS

## 2015-01-27 MED ORDER — DIPHENHYDRAMINE HCL 50 MG/ML IJ SOLN
25.0000 mg | Freq: Once | INTRAMUSCULAR | Status: AC
  Administered 2015-01-27: 25 mg via INTRAVENOUS
  Filled 2015-01-27: qty 1

## 2015-01-27 MED ORDER — MORPHINE SULFATE 4 MG/ML IJ SOLN
4.0000 mg | Freq: Once | INTRAMUSCULAR | Status: AC
  Administered 2015-01-27: 4 mg via INTRAVENOUS
  Filled 2015-01-27: qty 1

## 2015-01-27 NOTE — ED Provider Notes (Signed)
CSN: [id number]     Arrival date & time 01/27/15  1833 History  This chart was scribed for Natasha Patches, MD by Steva Colder, ED Scribe. The patient was seen in room MH06/MH06 at 7:39 PM.     Chief Complaint  Patient presents with  . Headache      The history is provided by the patient. No language interpreter was used.    HPI Comments: Natasha Castro is a 34 y.o. female with a medical hx of HTN who presents to the Emergency Department complaining of HA onset last night 10 PM. Pt was not doing anything in particular when the HA came. Pt notes that the HA came on gradually and worsened this afternoon. Pt notes that her HA is frontal and the top of her head and she can feel it throbbing. Pt reports that she does not know of anything that will make it better or worse. Pt has had HA in the past but this current one is different because she has neck pain. Pt has had HA in the past that were relieved with Motrin and food.  She states that she is having associated symptoms of photophobia, visual disturbance. She states that she has tried motrin at 2:30 PM today with no relief for her symptoms. She denies n/v, SOB, numbness, weakness, and any other symptoms. Pt has been out of her HTN medications for one year and she can not remember which one she was on. Pt was on it while she was pregnant and she was not on HTN medications before she got pregnant. Pt denies taking any medications daily. Pt denies any other medical issues. Pt is not sure of what her blood pressure normally runs. Pt last had a HA when her period was on at the beginning of the month. Pt is not allergic to any medications.  Pt blood pressure on right arm: 188/99   LMP - Patient's last menstrual period was 01/16/2015.  Past Medical History  Diagnosis Date  . Hypertension   . Boil    Past Surgical History  Procedure Laterality Date  . Tubal ligation     History reviewed. No pertinent family history. History  Substance Use Topics   . Smoking status: Never Smoker   . Smokeless tobacco: Not on file  . Alcohol Use: No   OB History    No data available     Review of Systems  Eyes: Positive for photophobia and visual disturbance.  Respiratory: Negative for shortness of breath.   Gastrointestinal: Negative for nausea and vomiting.  Musculoskeletal: Positive for neck pain.  Neurological: Positive for headaches. Negative for weakness and numbness.    A complete 10 system review of systems was obtained and all systems are negative except as noted in the HPI and PMH.    Allergies  Review of patient's allergies indicates no known allergies.  Home Medications   Prior to Admission medications   Medication Sig Start Date End Date Taking? Authorizing Provider  acetaminophen (TYLENOL) 500 MG tablet Take 1 tablet (500 mg total) by mouth every 6 (six) hours as needed for mild pain or moderate pain. 12/31/14   Domenic Moras, PA-C  Carvedilol (COREG PO) Take by mouth.    Historical Provider, MD  cephALEXin (KEFLEX) 500 MG capsule Take 1 capsule (500 mg total) by mouth 4 (four) times daily. 12/31/14   Domenic Moras, PA-C  cyclobenzaprine (FLEXERIL) 10 MG tablet Take 1 tablet (10 mg total) by mouth 2 (two) times daily as needed  for muscle spasms. 02/23/14   Blanchie Dessert, MD  ondansetron (ZOFRAN) 4 MG tablet Take 1 tablet (4 mg total) by mouth every 6 (six) hours as needed for nausea or vomiting. 05/18/14   Delora Fuel, MD  phenazopyridine (PYRIDIUM) 200 MG tablet Take 1 tablet (200 mg total) by mouth 3 (three) times daily as needed for pain. 01/19/14   Veryl Speak, MD  sulfamethoxazole-trimethoprim (SEPTRA DS) 800-160 MG per tablet Take 1 tablet by mouth 2 (two) times daily. 12/13/13   April Palumbo, MD   BP 179/111 mmHg  Pulse 81  Temp(Src) 98.6 F (37 C)  Wt 164 lb (74.39 kg)  SpO2 100%  LMP 01/16/2015  Physical Exam  Constitutional: She is oriented to person, place, and time. She appears well-developed and well-nourished. No  distress.  HENT:  Head: Normocephalic and atraumatic.  Eyes: EOM are normal.  Neck: Neck supple. No tracheal deviation present.  Cardiovascular: Normal rate.   Pulmonary/Chest: Effort normal. No respiratory distress.  Musculoskeletal: Normal range of motion.  Neurological: She is alert and oriented to person, place, and time. She displays no atrophy and no tremor. No cranial nerve deficit or sensory deficit. She exhibits normal muscle tone. She displays no seizure activity. Coordination and gait normal. GCS eye subscore is 4. GCS verbal subscore is 5. GCS motor subscore is 6.  Skin: Skin is warm and dry.  Psychiatric: She has a normal mood and affect. Her behavior is normal.  Nursing note and vitals reviewed.   ED Course  Procedures (including critical care time) DIAGNOSTIC STUDIES: Oxygen Saturation is 100% on RA, nl by my interpretation.    COORDINATION OF CARE: 7:47 PM-Discussed treatment plan which includes CBC, CT head without contrast, blood pressure recheck, morphine injection, reglan injection, benadryl injection, and IV fluids with pt at bedside and pt agreed to plan.   Labs Review Labs Reviewed  BASIC METABOLIC PANEL - Abnormal; Notable for the following:    Potassium 3.3 (*)    All other components within normal limits    Imaging Review Ct Head Wo Contrast  01/27/2015   CLINICAL DATA:  Severe headaches today.  EXAM: CT HEAD WITHOUT CONTRAST  TECHNIQUE: Contiguous axial images were obtained from the base of the skull through the vertex without intravenous contrast.  COMPARISON:  12/10/2009  FINDINGS: The ventricles are normal in size and configuration. No extra-axial fluid collections are identified. The gray-white differentiation is normal. No CT findings for acute intracranial process such as hemorrhage or infarction. No mass lesions. The brainstem and cerebellum are grossly normal.  The bony structures are intact. The paranasal sinuses and mastoid air cells are clear. The  globes are intact.  IMPRESSION: Normal head CT.   Electronically Signed   By: Marijo Sanes M.D.   On: 01/27/2015 21:25     EKG Interpretation None      MDM   Final diagnoses:  Nonintractable headache, unspecified chronicity pattern, unspecified headache type  Secondary hypertension, unspecified    Pt is a 34 y.o. female with Pmhx as above who presents with h/a for 1 day, and HTN (OOM for 1 year).  No fevers, chills, coughing, neck stiffness, numbness, weakness, confusion, chest pain, shortness of breath or visual changes.  On physical exam, vital signs are stable.  Patient is in no acute distress.  Exam is remarkable.  CT had ordered which was negative.  BMP is normal.  Patient is no evidence of end organ damage.  Headache is improved a migraine cocktail and  blood pressures significantly improved.  I've asked her to start a blood pressure log and follow up closely with her PCP as an outpatient to determine whether she needs to be on antihypertensives at home.     Tery Sanfilippo evaluation in the Emergency Department is complete. It has been determined that no acute conditions requiring further emergency intervention are present at this time. The patient/guardian have been advised of the diagnosis and plan. We have discussed signs and symptoms that warrant return to the ED, such as changes or worsening in symptoms, worsening pain, chest pain, shortness breath, fever.    I personally performed the services described in this documentation, which was scribed in my presence. The recorded information has been reviewed and is accurate.     Natasha Patches, MD 01/28/15 1739

## 2015-01-27 NOTE — Discharge Instructions (Signed)

## 2015-01-27 NOTE — ED Notes (Addendum)
Pt c/o h/a and increased BP x 1 day Pt ran out of BP x 1 year ago

## 2015-01-27 NOTE — ED Notes (Signed)
Pt reports Headache, has had HA before but motrin and food has always helped.  Took Motrin at 1330 today with no relief.  Pressure/ heat around eyes, whole head , even neck hurts.  Rates it 9 (0-10).  No c/o N/V, or SOB.  but did experience blurred vision earlier today.

## 2015-12-26 ENCOUNTER — Emergency Department (HOSPITAL_BASED_OUTPATIENT_CLINIC_OR_DEPARTMENT_OTHER)
Admission: EM | Admit: 2015-12-26 | Discharge: 2015-12-26 | Disposition: A | Discharge status: Home / Self Care | Payer: Medicaid Other | Attending: Emergency Medicine | Admitting: Emergency Medicine

## 2015-12-26 ENCOUNTER — Encounter (HOSPITAL_BASED_OUTPATIENT_CLINIC_OR_DEPARTMENT_OTHER): Payer: Self-pay | Admitting: Emergency Medicine

## 2015-12-26 DIAGNOSIS — R300 Dysuria: Secondary | ICD-10-CM | POA: Diagnosis present

## 2015-12-26 DIAGNOSIS — Z3202 Encounter for pregnancy test, result negative: Secondary | ICD-10-CM | POA: Insufficient documentation

## 2015-12-26 DIAGNOSIS — Z872 Personal history of diseases of the skin and subcutaneous tissue: Secondary | ICD-10-CM | POA: Insufficient documentation

## 2015-12-26 DIAGNOSIS — Z792 Long term (current) use of antibiotics: Secondary | ICD-10-CM | POA: Insufficient documentation

## 2015-12-26 DIAGNOSIS — R350 Frequency of micturition: Secondary | ICD-10-CM | POA: Diagnosis not present

## 2015-12-26 DIAGNOSIS — I10 Essential (primary) hypertension: Secondary | ICD-10-CM | POA: Insufficient documentation

## 2015-12-26 DIAGNOSIS — R1030 Lower abdominal pain, unspecified: Secondary | ICD-10-CM | POA: Diagnosis not present

## 2015-12-26 DIAGNOSIS — Z8673 Personal history of transient ischemic attack (TIA), and cerebral infarction without residual deficits: Secondary | ICD-10-CM | POA: Insufficient documentation

## 2015-12-26 LAB — URINALYSIS, ROUTINE W REFLEX MICROSCOPIC
Bilirubin Urine: NEGATIVE
Glucose, UA: NEGATIVE mg/dL
Ketones, ur: NEGATIVE mg/dL
Nitrite: NEGATIVE
PROTEIN: NEGATIVE mg/dL
Specific Gravity, Urine: 1.009 (ref 1.005–1.030)
pH: 6.5 (ref 5.0–8.0)

## 2015-12-26 LAB — URINE MICROSCOPIC-ADD ON

## 2015-12-26 LAB — PREGNANCY, URINE: Preg Test, Ur: NEGATIVE

## 2015-12-26 MED ORDER — CEPHALEXIN 250 MG PO CAPS
500.0000 mg | ORAL_CAPSULE | Freq: Once | ORAL | Status: AC
  Administered 2015-12-26: 500 mg via ORAL
  Filled 2015-12-26: qty 2

## 2015-12-26 MED ORDER — PHENAZOPYRIDINE HCL 200 MG PO TABS: 200.0000 mg | ORAL_TABLET | Freq: Three times a day (TID) | ORAL | Status: AC

## 2015-12-26 MED ORDER — CEPHALEXIN 500 MG PO CAPS: 500.0000 mg | ORAL_CAPSULE | Freq: Four times a day (QID) | ORAL | Status: DC

## 2015-12-26 NOTE — ED Notes (Signed)
Patient states that she is having pain to her left lower abdominal area and burning with Urination. Reports that she knows what a UTI feels like and she has one.

## 2015-12-26 NOTE — ED Provider Notes (Signed)
CSN: [id number]     Arrival date & time 12/26/15  1926 History  By signing my name below, I, Helane Gunther, attest that this documentation has been prepared under the direction and in the presence of Alfonzo Beers, MD. Electronically Signed: Helane Gunther, ED Scribe. 12/26/2015. 10:32 PM.     Chief Complaint  Patient presents with  . Dysuria   The history is provided by the patient. No language interpreter was used.   HPI Comments: Natasha Castro is a 35 y.o. female with a PMHx of HTN who presents to the Emergency Department complaining of worsening dysuria onset 5 days ago. She reports associated lower abdominal pressure, frequency, and right-sided flank pain. She notes she has been drinking cranberry juice, water, and coffee. She states her last UTI was 2 years ago. Pt denies fever, vaginal discharge or bleeding, and vomiting. She reports NKDA.   Past Medical History  Diagnosis Date  . Hypertension   . Boil    Past Surgical History  Procedure Laterality Date  . Tubal ligation     History reviewed. No pertinent family history. Social History  Substance Use Topics  . Smoking status: Never Smoker   . Smokeless tobacco: None  . Alcohol Use: No   OB History    No data available     Review of Systems  Constitutional: Negative for fever.  Gastrointestinal: Positive for abdominal pain ("pressure"). Negative for vomiting.  Genitourinary: Positive for dysuria, frequency and flank pain. Negative for vaginal bleeding and vaginal discharge.  All other systems reviewed and are negative.   Allergies  Review of patient's allergies indicates no known allergies.  Home Medications   Prior to Admission medications   Medication Sig Start Date End Date Taking? Authorizing Provider  acetaminophen (TYLENOL) 500 MG tablet Take 1 tablet (500 mg total) by mouth every 6 (six) hours as needed for mild pain or moderate pain. 12/31/14   Domenic Moras, PA-C  Carvedilol (COREG PO) Take by mouth.     Historical Provider, MD  cephALEXin (KEFLEX) 500 MG capsule Take 1 capsule (500 mg total) by mouth 4 (four) times daily. 12/26/15   Alfonzo Beers, MD  cyclobenzaprine (FLEXERIL) 10 MG tablet Take 1 tablet (10 mg total) by mouth 2 (two) times daily as needed for muscle spasms. 02/23/14   Blanchie Dessert, MD  ondansetron (ZOFRAN) 4 MG tablet Take 1 tablet (4 mg total) by mouth every 6 (six) hours as needed for nausea or vomiting. [id number]   Delora Fuel, MD  phenazopyridine (PYRIDIUM) 200 MG tablet Take 1 tablet (200 mg total) by mouth 3 (three) times daily. 12/26/15   Alfonzo Beers, MD  sulfamethoxazole-trimethoprim (SEPTRA DS) 800-160 MG per tablet Take 1 tablet by mouth 2 (two) times daily. 12/13/13   April Palumbo, MD   BP 158/103 mmHg  Pulse 72  Temp(Src) 98.5 F (36.9 C) (Oral)  Resp 16  Ht 5\' 4"  (1.626 m)  Wt 164 lb (74.39 kg)  BMI 28.14 kg/m2  SpO2 100%  LMP 12/26/2015  Vitals reviewed Physical Exam  Physical Examination: General appearance - alert, well appearing, and in no distress Mental status - alert, oriented to person, place, and time Eyes - no conjunctival injection, no scleral icterus Chest - clear to auscultation, no wheezes, rales or rhonchi, symmetric air entry Heart - normal rate, regular rhythm, normal S1, S2, no murmurs, rubs, clicks or gallops Abdomen - soft, nontender, nondistended, no masses or organomegaly Neurological - alert, oriented, normal speech Back- mild right CVA tenderness,  no midline spinal tenderness Extremities - peripheral pulses normal, no pedal edema, no clubbing or cyanosis Skin - normal coloration and turgor, no rashes  ED Course  Procedures  DIAGNOSTIC STUDIES: Oxygen Saturation is 100% on RA, normal by my interpretation.    COORDINATION OF CARE: 10:31 PM - Discussed probable UTI and plans to order antibiiotics. Pt advised of plan for treatment and pt agrees.  Labs Review Labs Reviewed  URINALYSIS, ROUTINE W REFLEX MICROSCOPIC (NOT AT  [facility]) - Abnormal; Notable for the following:    APPearance CLOUDY (*)    Hgb urine dipstick LARGE (*)    Leukocytes, UA LARGE (*)    All other components within normal limits  URINE MICROSCOPIC-ADD ON - Abnormal; Notable for the following:    Squamous Epithelial / LPF 0-5 (*)    Bacteria, UA MANY (*)    All other components within normal limits  PREGNANCY, URINE    Imaging Review No results found. I have personally reviewed and evaluated these images and lab results as part of my medical decision-making.   EKG Interpretation None      MDM   Final diagnoses:  Dysuria  UTI  Pt presenting with c/o dysuria, urgency and frequency.  Urine is c/w UTI.  Mild right sided CVA tenderness- no fever or vomiting however.  Pt started on keflex and given rx for pyridium as well.  No abdominal tenderness.   Discharged with strict return precautions.  Pt agreeable with plan.  I personally performed the services described in this documentation, which was scribed in my presence. The recorded information has been reviewed and is accurate.    Alfonzo Beers, MD 12/26/15 [phone]

## 2015-12-26 NOTE — Discharge Instructions (Signed)
Return to the ED with any concerns including vomiting and not able to keep down liquids or antibitoics, abdominal pain, fever/chills, decreased level of alertness/lethargy, or any other alarming symptoms

## 2016-03-26 ENCOUNTER — Encounter (HOSPITAL_BASED_OUTPATIENT_CLINIC_OR_DEPARTMENT_OTHER): Payer: Self-pay | Admitting: Emergency Medicine

## 2016-03-26 ENCOUNTER — Emergency Department (HOSPITAL_BASED_OUTPATIENT_CLINIC_OR_DEPARTMENT_OTHER)
Admission: EM | Admit: 2016-03-26 | Discharge: 2016-03-26 | Disposition: A | Discharge status: Home / Self Care | Payer: Medicaid Other | Attending: Emergency Medicine | Admitting: Emergency Medicine

## 2016-03-26 DIAGNOSIS — L02411 Cutaneous abscess of right axilla: Secondary | ICD-10-CM

## 2016-03-26 DIAGNOSIS — Z79899 Other long term (current) drug therapy: Secondary | ICD-10-CM | POA: Insufficient documentation

## 2016-03-26 DIAGNOSIS — I10 Essential (primary) hypertension: Secondary | ICD-10-CM | POA: Diagnosis not present

## 2016-03-26 MED ORDER — IBUPROFEN 800 MG PO TABS
ORAL_TABLET | ORAL | Status: AC
  Filled 2016-03-26: qty 1

## 2016-03-26 MED ORDER — IBUPROFEN 800 MG PO TABS: 800.0000 mg | ORAL_TABLET | Freq: Three times a day (TID) | ORAL | Status: DC | PRN

## 2016-03-26 MED ORDER — CEPHALEXIN 500 MG PO CAPS: 1000.0000 mg | ORAL_CAPSULE | Freq: Two times a day (BID) | ORAL | Status: DC

## 2016-03-26 MED ORDER — IBUPROFEN 800 MG PO TABS
800.0000 mg | ORAL_TABLET | Freq: Once | ORAL | Status: AC
  Administered 2016-03-26: 800 mg via ORAL

## 2016-03-26 MED ORDER — LIDOCAINE-EPINEPHRINE 2 %-1:100000 IJ SOLN
20.0000 mL | Freq: Once | INTRAMUSCULAR | Status: DC
  Filled 2016-03-26: qty 1

## 2016-03-26 MED ORDER — SULFAMETHOXAZOLE-TRIMETHOPRIM 800-160 MG PO TABS: 1.0000 | ORAL_TABLET | Freq: Two times a day (BID) | ORAL | Status: DC

## 2016-03-26 MED ORDER — HYDROCODONE-ACETAMINOPHEN 5-325 MG PO TABS
1.0000 | ORAL_TABLET | Freq: Four times a day (QID) | ORAL | Status: AC | PRN
Start: 2016-03-26 — End: ?

## 2016-03-26 NOTE — Discharge Instructions (Signed)
Continue to apply warm compress and take antibiotics as prescribed.  Follow up with your doctor or return in 2 days for wound recheck and packing removal.   Abscess An abscess (boil or furuncle) is an infected area on or under the skin. This area is filled with yellowish-white fluid (pus) and other material (debris). HOME CARE   Only take medicines as told by your doctor.  If you were given antibiotic medicine, take it as directed. Finish the medicine even if you start to feel better.  If gauze is used, follow your doctor's directions for changing the gauze.  To avoid spreading the infection:  Keep your abscess covered with a bandage.  Wash your hands well.  Do not share personal care items, towels, or whirlpools with others.  Avoid skin contact with others.  Keep your skin and clothes clean around the abscess.  Keep all doctor visits as told. GET HELP RIGHT AWAY IF:   You have more pain, puffiness (swelling), or redness in the wound site.  You have more fluid or blood coming from the wound site.  You have muscle aches, chills, or you feel sick.  You have a fever. MAKE SURE YOU:   Understand these instructions.  Will watch your condition.  Will get help right away if you are not doing well or get worse.   This information is not intended to replace advice given to you by your health care provider. Make sure you discuss any questions you have with your health care provider.   Document Released: 03/20/2008 Document Revised: 04/02/2012 Document Reviewed: 12/16/2011 Elsevier Interactive Patient Education [employer].

## 2016-03-26 NOTE — ED Notes (Signed)
Pt noted abscess to right axilla, h/o previous abscess that required lancing

## 2016-03-26 NOTE — ED Notes (Signed)
Dry dressing applied to right axilla, packing remains in place, educated pt on wound care and coverage for wound in axilla, additional information provided on cause of recurrent skin infections, pt very appreciative

## 2016-03-26 NOTE — ED Provider Notes (Signed)
CSN: [id number]     Arrival date & time 03/26/16  0920 History   First MD Initiated Contact with Patient 03/26/16 [phone]     Chief Complaint  Patient presents with  . Abscess     (Consider location/radiation/quality/duration/timing/severity/associated sxs/prior Treatment) HPI   35 year old female with history of hypertension and recurrent abscess presents with complaints of abscess to her right armpit. Patient report for the past week she has had gradual onset of sharp throbbing pain to her right axillary region with increased swelling and tenderness to palpation. Symptoms felt similar to prior abscess in the same armpit that she had lanced in the past. She has been using warm compress without adequate relief. Pain is currently moderate to severe. No drainage. Denies fever, chest pain, shortness of breath, productive cough, arm numbness or weakness. She is up-to-date with her tetanus.  Past Medical History  Diagnosis Date  . Hypertension   . Boil    Past Surgical History  Procedure Laterality Date  . Tubal ligation     History reviewed. No pertinent family history. Social History  Substance Use Topics  . Smoking status: Never Smoker   . Smokeless tobacco: None  . Alcohol Use: No   OB History    No data available     Review of Systems  Constitutional: Negative for fever.  Cardiovascular: Negative for chest pain.  Gastrointestinal: Negative for abdominal pain.      Allergies  Review of patient's allergies indicates no known allergies.  Home Medications   Prior to Admission medications   Medication Sig Start Date End Date Taking? Authorizing Provider  acetaminophen (TYLENOL) 500 MG tablet Take 1 tablet (500 mg total) by mouth every 6 (six) hours as needed for mild pain or moderate pain. 12/31/14   Domenic Moras, PA-C  Carvedilol (COREG PO) Take by mouth.    Historical Provider, MD  cephALEXin (KEFLEX) 500 MG capsule Take 1 capsule (500 mg total) by mouth 4 (four) times  daily. 12/26/15   Alfonzo Beers, MD  cyclobenzaprine (FLEXERIL) 10 MG tablet Take 1 tablet (10 mg total) by mouth 2 (two) times daily as needed for muscle spasms. 02/23/14   Blanchie Dessert, MD  ondansetron (ZOFRAN) 4 MG tablet Take 1 tablet (4 mg total) by mouth every 6 (six) hours as needed for nausea or vomiting. [id number]   Delora Fuel, MD  phenazopyridine (PYRIDIUM) 200 MG tablet Take 1 tablet (200 mg total) by mouth 3 (three) times daily. 12/26/15   Alfonzo Beers, MD  sulfamethoxazole-trimethoprim (SEPTRA DS) 800-160 MG per tablet Take 1 tablet by mouth 2 (two) times daily. 12/13/13   April Palumbo, MD   BP 154/94 mmHg  Pulse 88  Temp(Src) 98.4 F (36.9 C) (Oral)  Resp 20  Ht 5\' 4"  (1.626 m)  Wt 76.204 kg  BMI 28.82 kg/m2  SpO2 100%  LMP 03/12/2016 Physical Exam  Constitutional: She appears well-developed and well-nourished. No distress.  HENT:  Head: Atraumatic.  Eyes: Conjunctivae are normal.  Neck: Neck supple.  Neurological: She is alert.  Skin:  Right axillary fold: Area of induration and fluctuance noted of moderate size, tender to palpation with surrounding skin erythema but no streaking.  Psychiatric: She has a normal mood and affect.  Nursing note and vitals reviewed.   ED Course  Procedures (including critical care time)  INCISION AND DRAINAGE Performed by: Domenic Moras Consent: Verbal consent obtained. Risks and benefits: risks, benefits and alternatives were discussed Type: abscess  Body area: R axillary fold  Anesthesia:  local infiltration  Incision was made with a scalpel.  Local anesthetic: lidocaine 2% w epinephrine  Anesthetic total: 6 ml  Complexity: complex Blunt dissection to break up loculations  Drainage: purulent  Drainage amount: moderate  Packing material: 1/2 in iodoform gauze  Patient tolerance: Patient tolerated the procedure well with no immediate complications.      MDM   Final diagnoses:  Cutaneous abscess of right axilla     BP 154/94 mmHg  Pulse 88  Temp(Src) 98.4 F (36.9 C) (Oral)  Resp 20  Ht 5\' 4"  (1.626 m)  Wt 76.204 kg  BMI 28.82 kg/m2  SpO2 100%  LMP 03/12/2016   9:43 AM Patient with a right axillary fold abscess amenable for drainage.  10:10 AM Successful I&D, moderate discharge noted.  Packing in place.  Pt has cellulitis overlying the abscess, will prescribe abx and pain meds.  Return in 2 days for wound check and packing removal.  Encourage warm compress.    Domenic Moras, PA-C 03/26/16 [place] Liu, MD 03/26/16 [id number]

## 2016-05-30 IMAGING — CT CT HEAD W/O CM
1 series · 16 of 30 positions shown, 20 images · non-contrast
Comparison: 12/10/2009

CLINICAL DATA: Severe headaches today.

EXAM:
CT HEAD WITHOUT CONTRAST
TECHNIQUE: Contiguous axial images were obtained from the base of the skull
through the vertex without intravenous contrast.

[Series 2: head 4.8 h37s · axial · 0.44mm/px · z∈[+1136,+1269]mm · 16 of 32 slices shown, 20 images]
[im 2/32  brain]
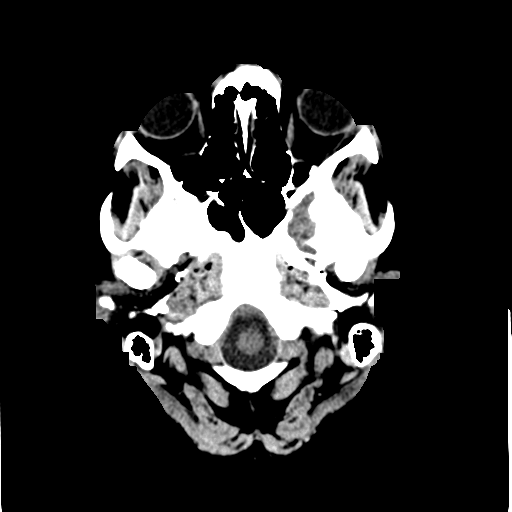
[im 2/32  bone]
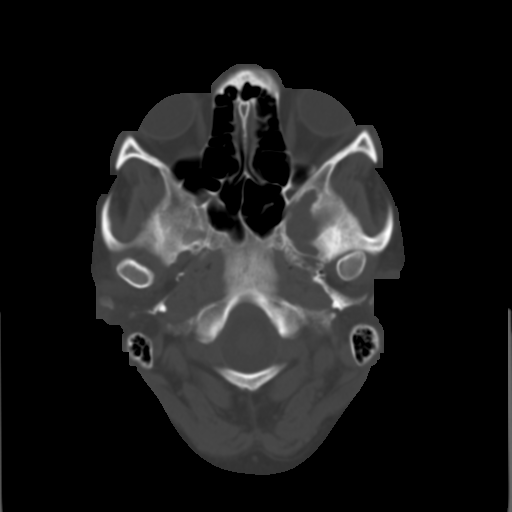
[im 4/32  brain]
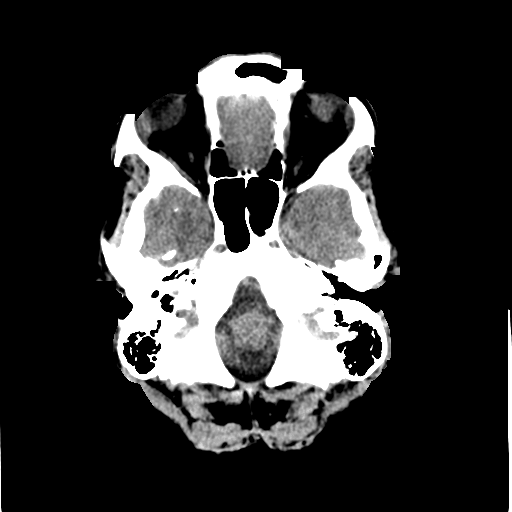
[im 6/32  brain]
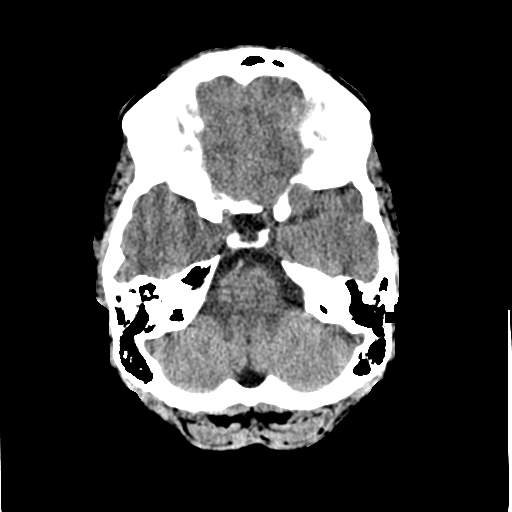
[im 8/32  brain]
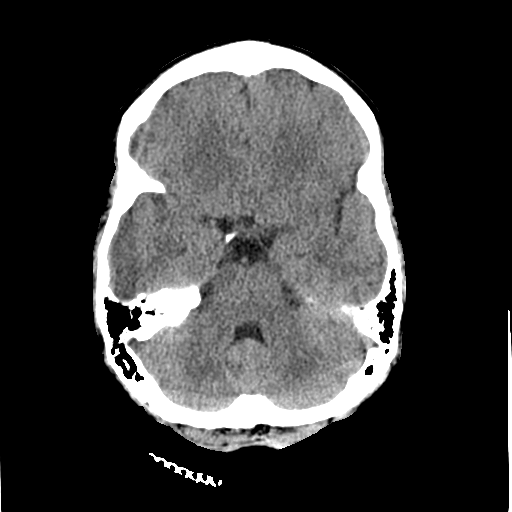
[im 9/32  brain]
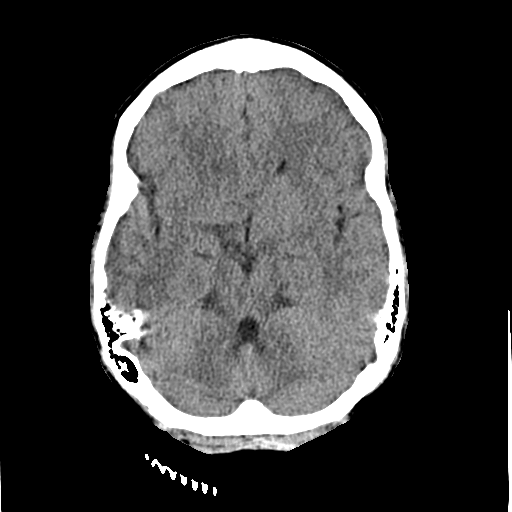
[im 9/32  bone]
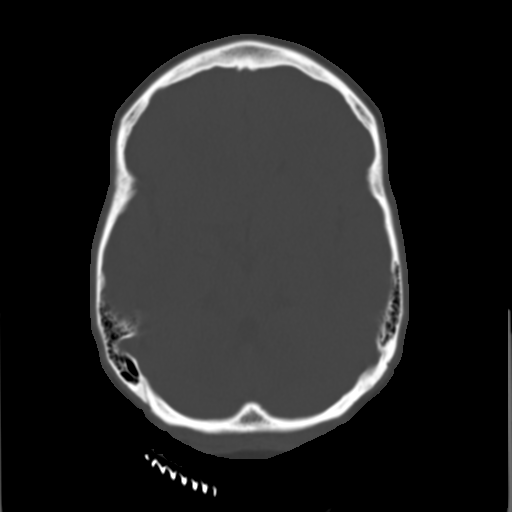
[im 11/32  brain]
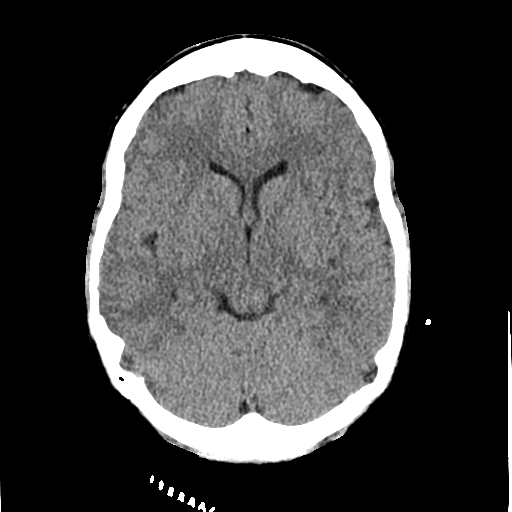
[im 13/32  brain]
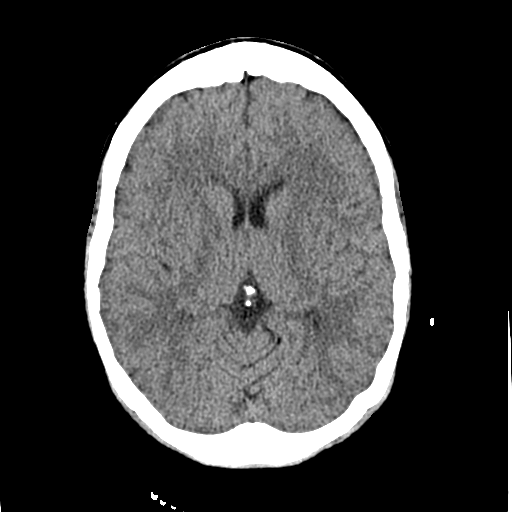
[im 15/32  brain]
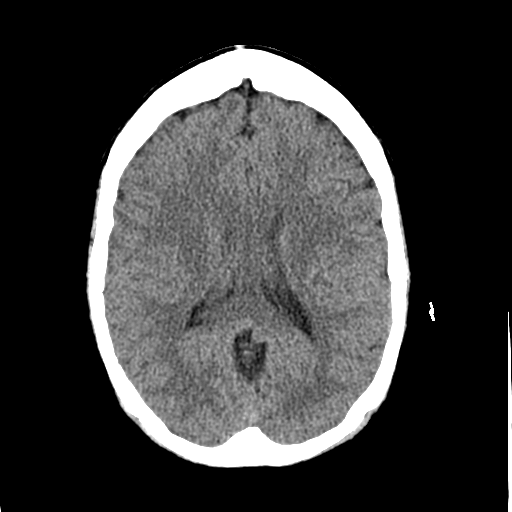
[im 17/32  brain]
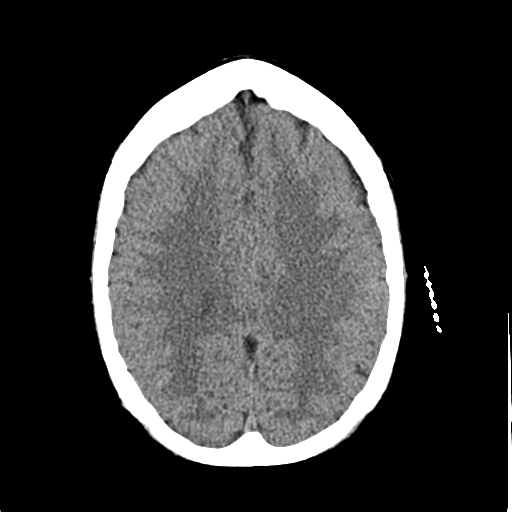
[im 17/32  bone]
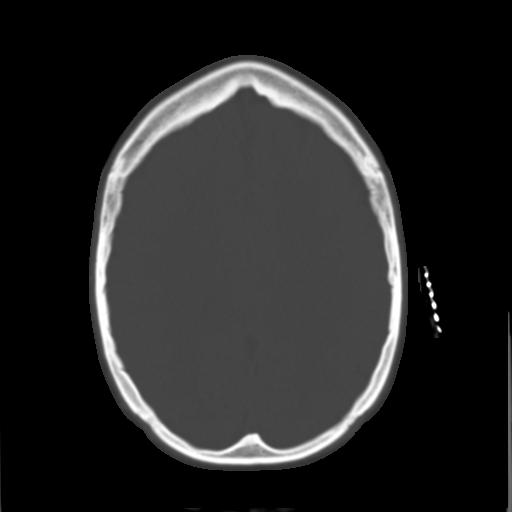
[im 19/32  brain]
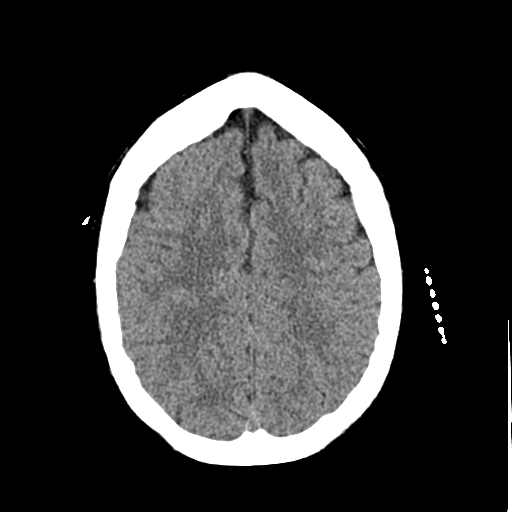
[im 21/32  brain]
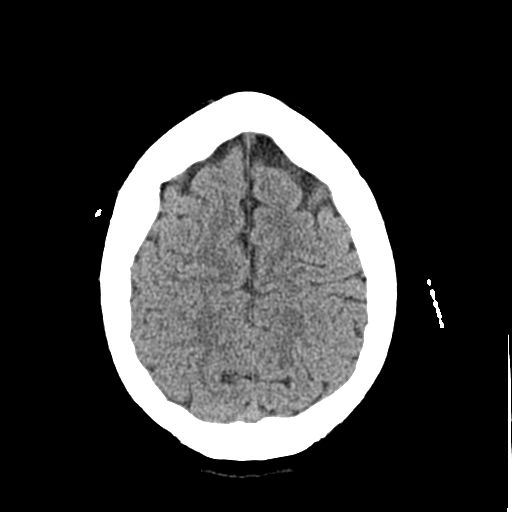
[im 23/32  brain]
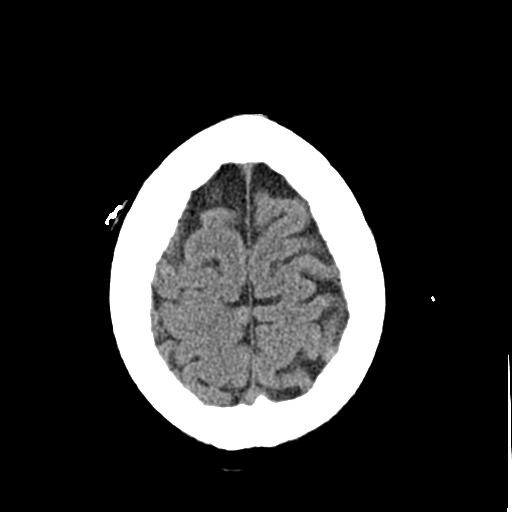
[im 24/32  brain]
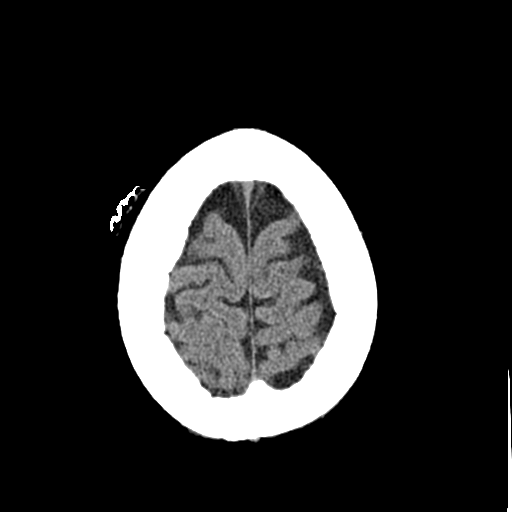
[im 24/32  bone]
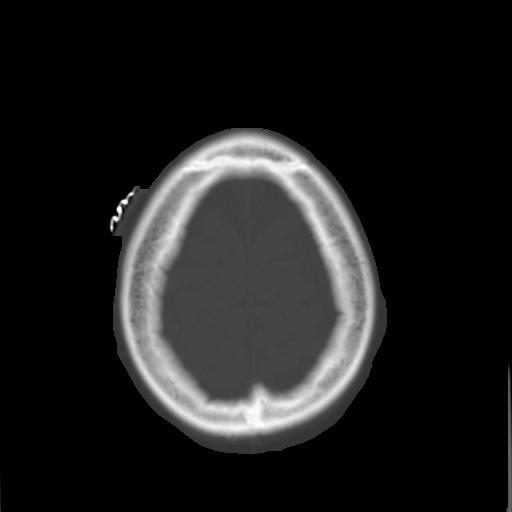
[im 26/32  brain]
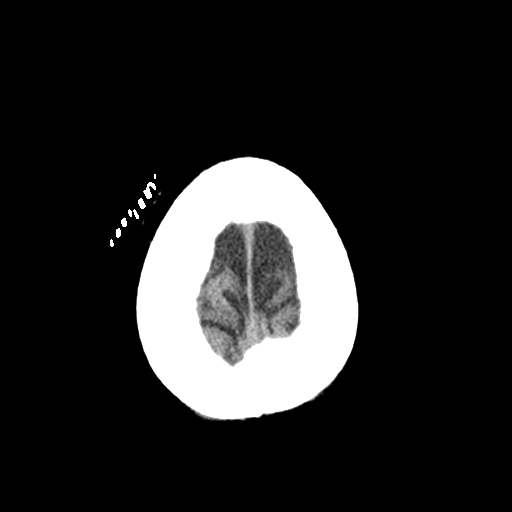
[im 28/32  brain]
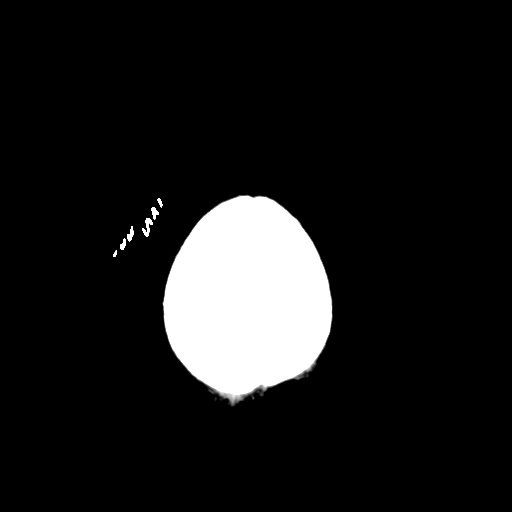
[im 30/32  brain]
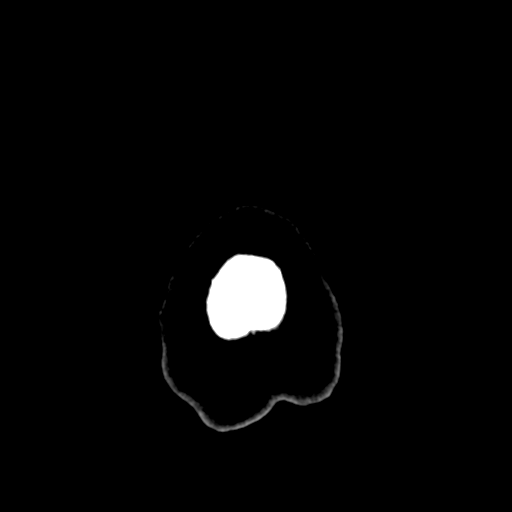

[16 of 30 positions shown; findings below may reference images not displayed]

FINDINGS: The ventricles are normal in size and configuration. No extra-axial
fluid collections are identified. The gray-white differentiation is
normal. No CT findings for acute intracranial process such as
hemorrhage or infarction. No mass lesions. The brainstem and
cerebellum are grossly normal.

The bony structures are intact. The paranasal sinuses and mastoid
air cells are clear. The globes are intact.
IMPRESSION: Normal head CT.

## 2016-10-11 ENCOUNTER — Emergency Department (HOSPITAL_COMMUNITY)
Admission: EM | Admit: 2016-10-11 | Discharge: 2016-10-11 | Disposition: A | Discharge status: Home / Self Care | Payer: Medicaid Other | Attending: Emergency Medicine | Admitting: Emergency Medicine

## 2016-10-11 ENCOUNTER — Encounter (HOSPITAL_COMMUNITY): Payer: Self-pay

## 2016-10-11 DIAGNOSIS — Z79899 Other long term (current) drug therapy: Secondary | ICD-10-CM | POA: Insufficient documentation

## 2016-10-11 DIAGNOSIS — R42 Dizziness and giddiness: Secondary | ICD-10-CM

## 2016-10-11 DIAGNOSIS — I10 Essential (primary) hypertension: Secondary | ICD-10-CM | POA: Insufficient documentation

## 2016-10-11 LAB — URINALYSIS, ROUTINE W REFLEX MICROSCOPIC
BILIRUBIN URINE: NEGATIVE
Glucose, UA: NEGATIVE mg/dL
Hgb urine dipstick: NEGATIVE
Ketones, ur: NEGATIVE mg/dL
Nitrite: NEGATIVE
PH: 6 (ref 5.0–8.0)
Protein, ur: NEGATIVE mg/dL
RBC / HPF: NONE SEEN RBC/hpf (ref 0–5)
SPECIFIC GRAVITY, URINE: 1.009 (ref 1.005–1.030)

## 2016-10-11 LAB — BASIC METABOLIC PANEL
Anion gap: 11 (ref 5–15)
BUN: 5 mg/dL — AB (ref 6–20)
CO2: 22 mmol/L (ref 22–32)
CREATININE: 0.54 mg/dL (ref 0.44–1.00)
Calcium: 9.3 mg/dL (ref 8.9–10.3)
Chloride: 105 mmol/L (ref 101–111)
GFR calc Af Amer: >60 mL/min (ref >60)
GFR calc non Af Amer: >60 mL/min (ref >60)
GLUCOSE: 99 mg/dL (ref 65–99)
POTASSIUM: 4.3 mmol/L (ref 3.5–5.1)
Sodium: 138 mmol/L (ref 135–145)

## 2016-10-11 LAB — CBC
HEMATOCRIT: 34 % — AB (ref 36.0–46.0)
Hemoglobin: 10.4 g/dL — ABNORMAL LOW (ref 12.0–15.0)
MCH: 22.4 pg — AB (ref 26.0–34.0)
MCHC: 30.6 g/dL (ref 30.0–36.0)
MCV: 73.3 fL — AB (ref 78.0–100.0)
PLATELETS: 450 10*3/uL — AB (ref 150–400)
RBC: 4.64 MIL/uL (ref 3.87–5.11)
RDW: 18 % — AB (ref 11.5–15.5)
WBC: 10.8 10*3/uL — ABNORMAL HIGH (ref 4.0–10.5)

## 2016-10-11 LAB — I-STAT TROPONIN, ED: TROPONIN I, POC: 0 ng/mL (ref 0.00–0.08)

## 2016-10-11 LAB — PREGNANCY, URINE: PREG TEST UR: NEGATIVE

## 2016-10-11 MED ORDER — HYDROCHLOROTHIAZIDE 25 MG PO TABS: 25.0000 mg | ORAL_TABLET | Freq: Every day | ORAL | 0 refills | Status: AC

## 2016-10-11 NOTE — ED Triage Notes (Addendum)
Pt presents for evaluation of period of dizziness starting at 0945 this AM. Pt reports episode lasted approx 20 min. Pt. Reports dizziness has improved but now has HA. NO focal neurological changes.  Pt. Reports hx of HTN, BP 181/104. Pt reports no hx of anxiety/panic attacks but states it feels like one.

## 2016-10-11 NOTE — ED Provider Notes (Signed)
[facility] DEPT Provider Note   CSN: [id number] Arrival date & time: 10/11/16  [id number]     History   Chief Complaint Chief Complaint  Patient presents with  . Dizziness    HPI Natasha Castro is a 35 y.o. female.  HPI Natasha Castro is a 35 y.o. female with PMH significant for HTN not on medication and just practicing diet and exercise for control x 1 year who presents with sudden onset, moderate, now resolved dizziness that lasted about 20 minutes.  She states she was standing at work when it began and she describes it as feeling lightheaded and as if she was spinning/moving.  She also experienced shortness of breath, felt like her heart was racing, chest tightness, and a mild headache.  No syncope, facial droop, slurred speech, numbness, weakness, visual changes, N/V, or any other symptoms.  She states she had not eaten anything this morning.  She reports she has not had adequate water intake, and has been drinking large amounts of caffeine.  No new medications.  The dizziness/lightheadedness is worse with standing.  It resolves with sitting or lying down. She has a PCP.  Past Medical History:  Diagnosis Date  . Boil   . Hypertension     There are no active problems to display for this patient.   Past Surgical History:  Procedure Laterality Date  . TUBAL LIGATION      OB History    No data available       Home Medications    Prior to Admission medications   Medication Sig Start Date End Date Taking? Authorizing Provider  acetaminophen (TYLENOL) 500 MG tablet Take 1 tablet (500 mg total) by mouth every 6 (six) hours as needed for mild pain or moderate pain. Patient not taking: Reported on 10/11/2016 12/31/14   Domenic Moras, PA-C  cephALEXin (KEFLEX) 500 MG capsule Take 2 capsules (1,000 mg total) by mouth 2 (two) times daily. Patient not taking: Reported on 10/11/2016 03/26/16   Domenic Moras, PA-C  cyclobenzaprine (FLEXERIL) 10 MG tablet Take 1 tablet (10 mg total) by mouth  2 (two) times daily as needed for muscle spasms. Patient not taking: Reported on 10/11/2016 02/23/14   Blanchie Dessert, MD  hydrochlorothiazide (HYDRODIURIL) 25 MG tablet Take 1 tablet (25 mg total) by mouth daily. 10/11/16   Gloriann Loan, PA-C  HYDROcodone-acetaminophen (NORCO/VICODIN) 5-325 MG tablet Take 1 tablet by mouth every 6 (six) hours as needed for moderate pain or severe pain. Patient not taking: Reported on 10/11/2016 03/26/16   Domenic Moras, PA-C  ibuprofen (ADVIL,MOTRIN) 800 MG tablet Take 1 tablet (800 mg total) by mouth every 8 (eight) hours as needed for moderate pain. Patient not taking: Reported on 10/11/2016 03/26/16   Domenic Moras, PA-C  ondansetron (ZOFRAN) 4 MG tablet Take 1 tablet (4 mg total) by mouth every 6 (six) hours as needed for nausea or vomiting. Patient not taking: Reported on 10/11/2016 [id number]   Delora Fuel, MD  phenazopyridine (PYRIDIUM) 200 MG tablet Take 1 tablet (200 mg total) by mouth 3 (three) times daily. Patient not taking: Reported on 10/11/2016 12/26/15   Alfonzo Beers, MD  sulfamethoxazole-trimethoprim (BACTRIM DS,SEPTRA DS) 800-160 MG tablet Take 1 tablet by mouth 2 (two) times daily. Patient not taking: Reported on 10/11/2016 03/26/16   Domenic Moras, PA-C    Family History No family history on file.  Social History Social History  Substance Use Topics  . Smoking status: Never Smoker  . Smokeless tobacco: Not on file  .  Alcohol use No     Allergies   Patient has no known allergies.   Review of Systems Review of Systems All other systems negative unless otherwise stated in HPI   Physical Exam Updated Vital Signs BP 162/91   Pulse 66   Temp 98.4 F (36.9 C) (Oral)   Resp 24   Ht 5\' 4"  (1.626 m)   Wt 74.4 kg   LMP 10/03/2016 (Exact Date)   SpO2 100%   BMI 28.15 kg/m   Physical Exam  Constitutional: She is oriented to person, place, and time. She appears well-developed and well-nourished.  Non-toxic appearance. She does not have a  sickly appearance. She does not appear ill.  HENT:  Head: Normocephalic and atraumatic.  Mouth/Throat: Oropharynx is clear and moist.  Eyes: Conjunctivae and EOM are normal. Pupils are equal, round, and reactive to light.  Neck: Normal range of motion. Neck supple.  Cardiovascular: Normal rate and regular rhythm.   Pulmonary/Chest: Effort normal and breath sounds normal. No accessory muscle usage or stridor. No respiratory distress. She has no wheezes. She has no rhonchi. She has no rales.  Abdominal: Soft. Bowel sounds are normal. She exhibits no distension. There is no tenderness.  Musculoskeletal: Normal range of motion.  Lymphadenopathy:    She has no cervical adenopathy.  Neurological: She is alert and oriented to person, place, and time.  Mental Status:   AOx3.  Speech clear without dysarthria. Cranial Nerves:  I-not tested  II-PERRLA  III, IV, VI-EOMs intact  V-temporal and masseter strength intact  VII-symmetrical facial movements intact, no facial droop  VIII-hearing grossly intact bilaterally  IX, X-gag intact  XI-strength of sternomastoid and trapezius muscles 5/5  XII-tongue midline Motor:   Good muscle bulk and tone  Strength 5/5 bilaterally in upper and lower extremities   Cerebellar--intact RAMs, finger to nose intact bilaterally.  Gait normal  No pronator drift Sensory:  Intact in upper and lower extremities   Skin: Skin is warm and dry.  Psychiatric: She has a normal mood and affect. Her behavior is normal.     ED Treatments / Results  Labs (all labs ordered are listed, but only abnormal results are displayed) Labs Reviewed  BASIC METABOLIC PANEL - Abnormal; Notable for the following:       Result Value   BUN 5 (*)    All other components within normal limits  CBC - Abnormal; Notable for the following:    WBC 10.8 (*)    Hemoglobin 10.4 (*)    HCT 34.0 (*)    MCV 73.3 (*)    MCH 22.4 (*)    RDW 18.0 (*)    Platelets 450 (*)    All other  components within normal limits  URINALYSIS, ROUTINE W REFLEX MICROSCOPIC - Abnormal; Notable for the following:    Leukocytes, UA TRACE (*)    Bacteria, UA RARE (*)    Squamous Epithelial / LPF 0-5 (*)    All other components within normal limits  PREGNANCY, URINE  I-STAT TROPOININ, ED    EKG  EKG Interpretation  Date/Time:  Wednesday October 11 2016 10:31:40 EST Ventricular Rate:  78 PR Interval:  158 QRS Duration: 92 QT Interval:  388 QTC Calculation: 442 R Axis:   -9 Text Interpretation:  Normal sinus rhythm Incomplete right bundle branch block T wave abnormality, consider anterior ischemia Abnormal ECG No old tracing to compare Confirmed by [facility]  MD, ELLIOTT [phone]) on 10/11/2016 1:41:33 PM  Radiology No results found.  Procedures Procedures (including critical care time)  Medications Ordered in ED Medications - No data to display   Initial Impression / Assessment and Plan / ED Course  I have reviewed the triage vital signs and the nursing notes.  Pertinent labs & imaging results that were available during my care of the patient were reviewed by me and considered in my medical decision making (see chart for details).  Clinical Course    Patient presents with now resolved episode of dizziness earlier this morning in the setting of decreased fluids and PO intake.  No fever, N/V/D, CP, or SOB.  No neurological symptoms.  No focal neurologic deficits on exam, I have low suspicion for acute neurologic process.  Labs without acute abnormalities.  EKG without acute changes and CXR clear.  Her BP has been elevated throughout her ED stay.  Patient states she has been trying to modify her diet and exercise over the last year to control her BP.  This does not seem to be working.  Discussed risks of uncontrolled HTN and patient will be started on HCTZ with close PCP follow up.  She has remained asymptomatic and able to ambulate in the ED without difficulty.  Likely related  to increased caffeine intake and degree of dehydration along with uncontrolled BP without evidence of end organ damage.  At this time, I have low suspicion for ACS (HEAR score 2), PE (low risk Well's, PERC negative), dissection, or infectious process.  Return precautions discussed.  Stable for discharge.   Final Clinical Impressions(s) / ED Diagnoses   Final diagnoses:  Dizziness  Hypertension, unspecified type    New Prescriptions Discharge Medication List as of 10/11/2016  4:00 PM    START taking these medications   Details  hydrochlorothiazide (HYDRODIURIL) 25 MG tablet Take 1 tablet (25 mg total) by mouth daily., Starting Wed 10/11/2016, Print         Gloriann Loan, PA-C 10/11/16 [place], MD 10/12/16 [phone]

## 2016-10-11 NOTE — Discharge Instructions (Signed)
Your work up today does not show an emergent cause for your symptoms.  However, your BP is elevated.  We are starting you on hydrochlorothiazide once daily.  Please follow up with your primary care physician to monitor your potassium.  Make sure to eat potassium rich foods including avocado, spinach, salmon, sweet potatoes, etc.  Return to the ED if you develop severe headache, confusion, chest pain, shortness of breath, or any new or concerning symptoms.

## 2016-10-11 NOTE — ED Notes (Signed)
EDP aware of high BP.

## 2017-12-17 ENCOUNTER — Emergency Department (HOSPITAL_COMMUNITY)
Admission: EM | Admit: 2017-12-17 | Discharge: 2017-12-17 | Disposition: A | Discharge status: Home / Self Care | Payer: Self-pay | Attending: Emergency Medicine | Admitting: Emergency Medicine

## 2017-12-17 ENCOUNTER — Encounter (HOSPITAL_COMMUNITY): Payer: Self-pay

## 2017-12-17 DIAGNOSIS — R202 Paresthesia of skin: Secondary | ICD-10-CM | POA: Insufficient documentation

## 2017-12-17 DIAGNOSIS — R42 Dizziness and giddiness: Secondary | ICD-10-CM | POA: Insufficient documentation

## 2017-12-17 DIAGNOSIS — Z5321 Procedure and treatment not carried out due to patient leaving prior to being seen by health care provider: Secondary | ICD-10-CM | POA: Insufficient documentation

## 2017-12-17 LAB — CBC
HCT: 37.3 % (ref 36.0–46.0)
Hemoglobin: 11.2 g/dL — ABNORMAL LOW (ref 12.0–15.0)
MCH: 22.8 pg — ABNORMAL LOW (ref 26.0–34.0)
MCHC: 30 g/dL (ref 30.0–36.0)
MCV: 76 fL — ABNORMAL LOW (ref 78.0–100.0)
Platelets: 482 10*3/uL — ABNORMAL HIGH (ref 150–400)
RBC: 4.91 MIL/uL (ref 3.87–5.11)
RDW: 17.4 % — ABNORMAL HIGH (ref 11.5–15.5)
WBC: 10 10*3/uL (ref 4.0–10.5)

## 2017-12-17 LAB — URINALYSIS, ROUTINE W REFLEX MICROSCOPIC
Bacteria, UA: NONE SEEN
Bilirubin Urine: NEGATIVE
Glucose, UA: NEGATIVE mg/dL
Ketones, ur: NEGATIVE mg/dL
Leukocytes, UA: NEGATIVE
Nitrite: NEGATIVE
Protein, ur: 30 mg/dL — AB
Specific Gravity, Urine: 1.005 (ref 1.005–1.030)
pH: 8 (ref 5.0–8.0)

## 2017-12-17 LAB — BASIC METABOLIC PANEL
Anion gap: 14 (ref 5–15)
BUN: 5 mg/dL — ABNORMAL LOW (ref 6–20)
CO2: 23 mmol/L (ref 22–32)
Calcium: 9.3 mg/dL (ref 8.9–10.3)
Chloride: 101 mmol/L (ref 101–111)
Creatinine, Ser: 0.48 mg/dL (ref 0.44–1.00)
GFR calc Af Amer: >60 mL/min (ref >60)
GFR calc non Af Amer: >60 mL/min (ref >60)
Glucose, Bld: 100 mg/dL — ABNORMAL HIGH (ref 65–99)
Potassium: 3.5 mmol/L (ref 3.5–5.1)
Sodium: 138 mmol/L (ref 135–145)

## 2017-12-17 LAB — I-STAT BETA HCG BLOOD, ED (MC, WL, AP ONLY): I-stat hCG, quantitative: <5 m[IU]/mL (ref <5)

## 2017-12-17 NOTE — ED Triage Notes (Signed)
Patient arrived by [facility] for acute dizziness and finger tingling while driving this am. On arrival reports daily stress but nothing unusual this am. alert and oriented on arrival. Reports dizziness resolving

## 2017-12-17 NOTE — ED Notes (Signed)
Called pt's name to obtain vital signs, no answer. 

## 2017-12-17 NOTE — ED Notes (Signed)
Second call in lobby. No response. 

## 2017-12-17 NOTE — ED Notes (Signed)
Pt never answered in waiting room, left post triage.

## 2020-03-31 ENCOUNTER — Emergency Department (HOSPITAL_BASED_OUTPATIENT_CLINIC_OR_DEPARTMENT_OTHER)
Admission: EM | Admit: 2020-03-31 | Discharge: 2020-03-31 | Disposition: A | Discharge status: Home / Self Care | Payer: Medicaid Other | Attending: Emergency Medicine | Admitting: Emergency Medicine

## 2020-03-31 ENCOUNTER — Other Ambulatory Visit: Payer: Self-pay

## 2020-03-31 ENCOUNTER — Encounter (HOSPITAL_BASED_OUTPATIENT_CLINIC_OR_DEPARTMENT_OTHER): Payer: Self-pay | Admitting: *Deleted

## 2020-03-31 DIAGNOSIS — Z9851 Tubal ligation status: Secondary | ICD-10-CM | POA: Diagnosis not present

## 2020-03-31 DIAGNOSIS — I10 Essential (primary) hypertension: Secondary | ICD-10-CM | POA: Diagnosis not present

## 2020-03-31 DIAGNOSIS — L02412 Cutaneous abscess of left axilla: Secondary | ICD-10-CM | POA: Diagnosis not present

## 2020-03-31 DIAGNOSIS — L0291 Cutaneous abscess, unspecified: Secondary | ICD-10-CM | POA: Diagnosis present

## 2020-03-31 DIAGNOSIS — Z79899 Other long term (current) drug therapy: Secondary | ICD-10-CM | POA: Diagnosis not present

## 2020-03-31 LAB — PREGNANCY, URINE: Preg Test, Ur: NEGATIVE

## 2020-03-31 MED ORDER — LIDOCAINE-EPINEPHRINE (PF) 2 %-1:200000 IJ SOLN
INTRAMUSCULAR | Status: AC
  Filled 2020-03-31: qty 10

## 2020-03-31 MED ORDER — LIDOCAINE-EPINEPHRINE (PF) 2 %-1:200000 IJ SOLN: 10.0000 mL | Freq: Once | INTRAMUSCULAR | Status: DC

## 2020-03-31 MED ORDER — LIDOCAINE-EPINEPHRINE 2 %-1:100000 IJ SOLN: 20.0000 mL | Freq: Once | INTRAMUSCULAR | Status: DC

## 2020-03-31 MED ORDER — SULFAMETHOXAZOLE-TRIMETHOPRIM 800-160 MG PO TABS
1.0000 | ORAL_TABLET | Freq: Two times a day (BID) | ORAL | 0 refills | Status: AC
Start: 2020-03-31 — End: 2020-04-07

## 2020-03-31 NOTE — Discharge Instructions (Addendum)
At this time there does not appear to be the presence of an emergent medical condition, however there is always the potential for conditions to change. Please read and follow the below instructions.  Please return to the Emergency Department immediately for any new or worsening symptoms. Please be sure to follow up with your Primary Care Provider within one week regarding your visit today; please call their office to schedule an appointment even if you are feeling better for a follow-up visit. Please take the antibiotic Bactrim as prescribed for treatment of infection.  Drink plenty of water and get plenty of rest.  Please continue using warm compress on your abscess to help facilitate continued drainage.  Have your abscess rechecked in 2 days to ensure improvement.  You may have it rechecked at your primary care doctor's office, an ultrasound or here at the emergency department. Your blood pressure was elevated in the emergency department today.  Please see your primary care doctor next week for blood pressure recheck medication management at that time.  Get help right away if: You have very bad (severe) pain. You see red streaks on your skin spreading away from the abscess. You have fever or chills You have any new/concerning worsening of symptoms  Please read the additional information packets attached to your discharge summary.  Do not take your medicine if  develop an itchy rash, swelling in your mouth or lips, or difficulty breathing; call 911 and seek immediate emergency medical attention if this occurs.  Note: Portions of this text may have been transcribed using voice recognition software. Every effort was made to ensure accuracy; however, inadvertent computerized transcription errors may still be present.

## 2020-03-31 NOTE — ED Notes (Signed)
ED Provider at bedside. 

## 2020-03-31 NOTE — ED Notes (Signed)
Pt states hx of abscess, usually gets I&D.

## 2020-03-31 NOTE — ED Triage Notes (Signed)
Boil under left arm x 3 days  Denies drainage

## 2020-03-31 NOTE — ED Provider Notes (Signed)
[facility] EMERGENCY DEPARTMENT Provider Note   CSN: [id number] Arrival date & time: 03/31/20  1644     History Chief Complaint  Patient presents with  . Abscess    Natasha Castro is a 39 y.o. female history of tubal ligation, abscesses, hypertension.  Patient presents today for pain and swelling to the left axilla for 3 days, no injury of the area.  She reports throbbing sensation constant worsened with palpation nonradiating moderate in intensity.  She reports pain feels similar to previous abscess which required incision and drainage.  Reports some redness overlying the area.  Denies fever/chills, abdominal pain, nausea/vomiting, numbness/weakness, tingling or any additional concerns today.  HPI     Past Medical History:  Diagnosis Date  . Boil   . Hypertension     There are no problems to display for this patient.   Past Surgical History:  Procedure Laterality Date  . TUBAL LIGATION       OB History   No obstetric history on file.     No family history on file.  Social History   Tobacco Use  . Smoking status: Never Smoker  Substance Use Topics  . Alcohol use: No  . Drug use: No    Home Medications Prior to Admission medications   Medication Sig Start Date End Date Taking? Authorizing Provider  metoprolol succinate (TOPROL-XL) 100 MG 24 hr tablet Take 100 mg by mouth daily. Take with or immediately following a meal.   Yes [provider]  hydrochlorothiazide (HYDRODIURIL) 25 MG tablet Take 1 tablet (25 mg total) by mouth daily. 10/11/16   Gloriann Loan, PA-C  HYDROcodone-acetaminophen (NORCO/VICODIN) 5-325 MG tablet Take 1 tablet by mouth every 6 (six) hours as needed for moderate pain or severe pain. Patient not taking: Reported on 10/11/2016 03/26/16   Domenic Moras, PA-C  phenazopyridine (PYRIDIUM) 200 MG tablet Take 1 tablet (200 mg total) by mouth 3 (three) times daily. Patient not taking: Reported on 10/11/2016 12/26/15   Pixie Casino, MD  sulfamethoxazole-trimethoprim (BACTRIM DS) 800-160 MG tablet Take 1 tablet by mouth 2 (two) times daily for 7 days. 03/31/20 04/07/20  Deliah Boston, PA-C    Allergies    Patient has no known allergies.  Review of Systems   Review of Systems  Constitutional: Negative for chills and fever.  Gastrointestinal: Negative.  Negative for abdominal pain, nausea and vomiting.  Skin: Positive for wound (Abscess).  Neurological: Negative.  Negative for weakness and numbness.    Physical Exam Updated Vital Signs BP (!) 163/96 (BP Location: Right Arm)   Pulse 71   Temp 98.5 F (36.9 C) (Oral)   Resp 16   Ht 5\' 4"  (1.626 m)   Wt 85.3 kg   LMP 03/10/2020 (Approximate)   SpO2 100%   BMI 32.27 kg/m   Physical Exam Constitutional:      General: She is not in acute distress.    Appearance: Normal appearance. She is well-developed. She is not ill-appearing or diaphoretic.  HENT:     Head: Normocephalic and atraumatic.  Eyes:     General: Vision grossly intact. Gaze aligned appropriately.     Pupils: Pupils are equal, round, and reactive to light.  Neck:     Trachea: Trachea and phonation normal.  Pulmonary:     Effort: Pulmonary effort is normal. No respiratory distress.  Abdominal:     General: There is no distension.     Palpations: Abdomen is soft.  Tenderness: There is no abdominal tenderness. There is no guarding or rebound.  Musculoskeletal:        General: Normal range of motion.     Cervical back: Normal range of motion.  Skin:    General: Skin is warm and dry.          Comments: Approximately 4 cm in diameter area of fluctuance to the left axilla with overlying erythema.  Tender to palpation.  Examination chaperoned by Anderson Malta RN  Neurological:     Mental Status: She is alert.     GCS: GCS eye subscore is 4. GCS verbal subscore is 5. GCS motor subscore is 6.     Comments: Speech is clear and goal oriented, follows commands Major Cranial nerves  without deficit, no facial droop Moves extremities without ataxia, coordination intact  Psychiatric:        Behavior: Behavior normal.     ED Results / Procedures / Treatments   Labs (all labs ordered are listed, but only abnormal results are displayed) Labs Reviewed  PREGNANCY, URINE    EKG None  Radiology No results found.  Procedures Ultrasound ED Soft Tissue  Date/Time: 03/31/2020 8:59 PM Performed by: Deliah Boston, PA-C Authorized by: Deliah Boston, PA-C   Procedure details:    Indications: localization of abscess and evaluate for cellulitis     Transverse view:  Visualized   Longitudinal view:  Visualized   Images: archived   Location:    Location: axilla     Side:  Left Findings:     abscess present    cellulitis present    no foreign body present  .Marland KitchenIncision and Drainage  Date/Time: 03/31/2020 8:59 PM Performed by: Deliah Boston, PA-C Authorized by: Deliah Boston, PA-C   Consent:    Consent obtained:  Verbal   Consent given by:  Patient   Risks discussed:  Damage to other organs, bleeding, infection, incomplete drainage and pain Location:    Type:  Abscess   Size:  4   Location: Left axilla. Pre-procedure details:    Skin preparation:  Betadine Anesthesia (see MAR for exact dosages):    Anesthesia method:  Local infiltration   Local anesthetic:  Lidocaine 1% WITH epi Procedure type:    Complexity:  Simple Procedure details:    Needle aspiration: yes     Needle size:  25 G   Incision types:  Single straight   Scalpel blade:  11   Wound management:  Probed and deloculated   Drainage:  Purulent and bloody   Drainage amount:  Scant   Wound treatment:  Wound left open   Packing materials:  None Post-procedure details:    Patient tolerance of procedure:  Tolerated well, no immediate complications Comments:     Procedure chaperoned by Juliette Mangle   (including critical care time)  Medications Ordered in ED Medications    lidocaine-EPINEPHrine (XYLOCAINE W/EPI) 2 %-1:200000 (PF) injection 10 mL (has no administration in time range)    ED Course  I have reviewed the triage vital signs and the nursing notes.  Pertinent labs & imaging results that were available during my care of the patient were reviewed by me and considered in my medical decision making (see chart for details).    MDM Rules/Calculators/A&P                          Additional History Obtained: 1. Nursing notes from this visit. 2. Chart  review shows a ED visit in 2017 in 2015 with abscesses of axilla. - 39 year old female with single left axillary skin abscess amenable to incision and drainage.  Confirmed by ultrasound prior to incision and drainage.  Abscess was not large enough to warrant packing or drain. Patient informed to have wound recheck in 2 days. I have encouraged home warm soaks and flushing.  Mild signs of cellulitis is surrounding skin.  Plan to discharge patient. Patient given extensive return precautions. Patient states understanding of return precautions and is agreeable with plan.  Given the surrounding cellulitis Bactrim was prescribed.  Patient had negative pregnancy test in the ER.  Denies history of CKD or adverse reaction to Bactrim.  At this time there does not appear to be any evidence of an acute emergency medical condition and the patient appears stable for discharge with appropriate outpatient follow up. Diagnosis was discussed with patient who verbalizes understanding of care plan and is agreeable to discharge. I have discussed return precautions with patient who verbalizes understanding. Patient encouraged to follow-up with their PCP. All questions answered.  Patient's case discussed with Dr. Sherry Ruffing who agrees with plan to discharge with follow-up.   Note: Portions of this report may have been transcribed using voice recognition software. Every effort was made to ensure accuracy; however, inadvertent computerized  transcription errors may still be present. Final Clinical Impression(s) / ED Diagnoses Final diagnoses:  Abscess of left axilla    Rx / DC Orders ED Discharge Orders         Ordered    sulfamethoxazole-trimethoprim (BACTRIM DS) 800-160 MG tablet  2 times daily     Discontinue  Reprint     03/31/20 2105           Deliah Boston, PA-C 03/31/20 2106    Tegeler, Gwenyth Allegra, MD 03/31/20 2351

## 2020-04-03 LAB — AEROBIC CULTURE W GRAM STAIN (SUPERFICIAL SPECIMEN): Culture: NORMAL
